# Patient Record
Sex: Female | Born: 1965 | Race: White | Hispanic: No | Smoking: Former smoker
Health system: Southern US, Community
[De-identification: ages and names within clinical notes are randomized; demographics above are authoritative.]

## PROBLEM LIST (undated history)

## (undated) DIAGNOSIS — F32A Depression, unspecified: Secondary | ICD-10-CM

## (undated) DIAGNOSIS — K219 Gastro-esophageal reflux disease without esophagitis: Secondary | ICD-10-CM

## (undated) DIAGNOSIS — F329 Major depressive disorder, single episode, unspecified: Secondary | ICD-10-CM

## (undated) DIAGNOSIS — R51 Headache: Secondary | ICD-10-CM

## (undated) DIAGNOSIS — F419 Anxiety disorder, unspecified: Secondary | ICD-10-CM

---
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
## 2005-03-24 HISTORY — PX: TONSILLECTOMY: SUR1361

## 2006-06-01 ENCOUNTER — Ambulatory Visit: Payer: Self-pay | Admitting: Family Medicine

## 2006-07-23 ENCOUNTER — Encounter: Admission: RE | Admit: 2006-07-23 | Discharge: 2006-07-23 | Payer: Self-pay | Admitting: Obstetrics and Gynecology

## 2006-08-25 ENCOUNTER — Ambulatory Visit: Payer: Self-pay | Admitting: Family Medicine

## 2006-08-25 LAB — CONVERTED CEMR LAB
Albumin: 4.1 g/dL (ref 3.5–5.2)
Basophils Relative: 0 % (ref 0.0–1.0)
Bilirubin, Direct: 0.1 mg/dL (ref 0.0–0.3)
CO2: 30 meq/L (ref 19–32)
Chloride: 107 meq/L (ref 96–112)
Creatinine, Ser: 0.9 mg/dL (ref 0.4–1.2)
Direct LDL: 176.1 mg/dL
Eosinophils Absolute: 0.1 10*3/uL (ref 0.0–0.6)
Hemoglobin: 14.8 g/dL (ref 12.0–15.0)
Lymphocytes Relative: 46.2 % — ABNORMAL HIGH (ref 12.0–46.0)
MCHC: 34.9 g/dL (ref 30.0–36.0)
MCV: 84.8 fL (ref 78.0–100.0)
Monocytes Relative: 8.5 % (ref 3.0–11.0)
Neutro Abs: 3.2 10*3/uL (ref 1.4–7.7)
Neutrophils Relative %: 43.8 % (ref 43.0–77.0)
Platelets: 319 10*3/uL (ref 150–400)
RBC: 5 M/uL (ref 3.87–5.11)
TSH: 2.03 microintl units/mL (ref 0.35–5.50)
Total Bilirubin: 0.7 mg/dL (ref 0.3–1.2)
Total Protein: 7.5 g/dL (ref 6.0–8.3)

## 2006-09-04 ENCOUNTER — Ambulatory Visit: Payer: Self-pay | Admitting: Family Medicine

## 2006-09-10 ENCOUNTER — Ambulatory Visit: Payer: Self-pay | Admitting: Family Medicine

## 2006-11-26 ENCOUNTER — Ambulatory Visit: Payer: Self-pay | Admitting: Family Medicine

## 2006-11-26 DIAGNOSIS — R519 Headache, unspecified: Secondary | ICD-10-CM | POA: Insufficient documentation

## 2006-11-26 DIAGNOSIS — I781 Nevus, non-neoplastic: Secondary | ICD-10-CM

## 2006-11-26 DIAGNOSIS — R51 Headache: Secondary | ICD-10-CM

## 2006-11-26 DIAGNOSIS — H8309 Labyrinthitis, unspecified ear: Secondary | ICD-10-CM | POA: Insufficient documentation

## 2007-01-14 ENCOUNTER — Telehealth (INDEPENDENT_AMBULATORY_CARE_PROVIDER_SITE_OTHER): Payer: Self-pay | Admitting: *Deleted

## 2007-01-15 ENCOUNTER — Ambulatory Visit: Payer: Self-pay | Admitting: Family Medicine

## 2007-01-15 LAB — CONVERTED CEMR LAB: Vitamin B-12: 269 pg/mL (ref 211–911)

## 2007-02-04 DIAGNOSIS — K602 Anal fissure, unspecified: Secondary | ICD-10-CM | POA: Insufficient documentation

## 2007-03-04 ENCOUNTER — Telehealth: Payer: Self-pay | Admitting: Family Medicine

## 2007-03-05 ENCOUNTER — Ambulatory Visit: Payer: Self-pay | Admitting: Family Medicine

## 2007-05-05 ENCOUNTER — Ambulatory Visit: Payer: Self-pay | Admitting: Family Medicine

## 2007-06-09 DIAGNOSIS — R1011 Right upper quadrant pain: Secondary | ICD-10-CM | POA: Insufficient documentation

## 2007-07-06 ENCOUNTER — Telehealth: Payer: Self-pay | Admitting: Family Medicine

## 2007-07-06 ENCOUNTER — Ambulatory Visit: Payer: Self-pay | Admitting: Family Medicine

## 2007-07-06 DIAGNOSIS — J45909 Unspecified asthma, uncomplicated: Secondary | ICD-10-CM | POA: Insufficient documentation

## 2007-07-06 LAB — CONVERTED CEMR LAB
ALT: 25 units/L (ref 0–35)
AST: 30 units/L (ref 0–37)
BUN: 6 mg/dL (ref 6–23)
Bilirubin, Direct: 0.1 mg/dL (ref 0.0–0.3)
CO2: 28 meq/L (ref 19–32)
Eosinophils Relative: 2.7 % (ref 0.0–5.0)
HCT: 41.9 % (ref 36.0–46.0)
Hemoglobin: 14.2 g/dL (ref 12.0–15.0)
MCHC: 33.9 g/dL (ref 30.0–36.0)
MCV: 87.6 fL (ref 78.0–100.0)
Monocytes Absolute: 0.8 10*3/uL (ref 0.1–1.0)
Neutro Abs: 2.7 10*3/uL (ref 1.4–7.7)
Platelets: 302 10*3/uL (ref 150–400)
Potassium: 3.6 meq/L (ref 3.5–5.1)
RBC: 4.78 M/uL (ref 3.87–5.11)
RDW: 12.3 % (ref 11.5–14.6)
Total Protein: 7.3 g/dL (ref 6.0–8.3)
WBC: 5.5 10*3/uL (ref 4.5–10.5)

## 2007-07-09 ENCOUNTER — Encounter: Admission: RE | Admit: 2007-07-09 | Discharge: 2007-07-09 | Payer: Self-pay | Admitting: Family Medicine

## 2007-07-13 ENCOUNTER — Ambulatory Visit: Payer: Self-pay | Admitting: Family Medicine

## 2007-08-12 ENCOUNTER — Encounter: Admission: RE | Admit: 2007-08-12 | Discharge: 2007-08-12 | Payer: Self-pay | Admitting: Obstetrics and Gynecology

## 2007-08-17 ENCOUNTER — Telehealth: Payer: Self-pay | Admitting: Family Medicine

## 2007-08-19 ENCOUNTER — Telehealth: Payer: Self-pay | Admitting: Internal Medicine

## 2007-08-31 ENCOUNTER — Ambulatory Visit: Payer: Self-pay | Admitting: Family Medicine

## 2007-08-31 LAB — CONVERTED CEMR LAB
Alkaline Phosphatase: 50 units/L (ref 39–117)
Basophils Relative: 0 % (ref 0.0–1.0)
Bilirubin Urine: NEGATIVE
Bilirubin, Direct: 0.1 mg/dL (ref 0.0–0.3)
Chloride: 103 meq/L (ref 96–112)
Cholesterol: 224 mg/dL (ref 0–200)
Eosinophils Relative: 1.4 % (ref 0.0–5.0)
GFR calc Af Amer: 90 mL/min
GFR calc non Af Amer: 74 mL/min
HDL: 36.7 mg/dL — ABNORMAL LOW (ref >39.0)
Ketones, urine, test strip: NEGATIVE
Lymphocytes Relative: 44.5 % (ref 12.0–46.0)
MCV: 89.9 fL (ref 78.0–100.0)
Monocytes Absolute: 0.4 10*3/uL (ref 0.1–1.0)
Monocytes Relative: 6.1 % (ref 3.0–12.0)
Neutro Abs: 2.9 10*3/uL (ref 1.4–7.7)
Nitrite: NEGATIVE
Platelets: 313 10*3/uL (ref 150–400)
Protein, U semiquant: NEGATIVE
RDW: 12.8 % (ref 11.5–14.6)
Specific Gravity, Urine: <1.005
TSH: 1.59 microintl units/mL (ref 0.35–5.50)
Total Bilirubin: 0.5 mg/dL (ref 0.3–1.2)
Urobilinogen, UA: 0.2
VLDL: 23 mg/dL (ref 0–40)
WBC Urine, dipstick: NEGATIVE
WBC: 6.1 10*3/uL (ref 4.5–10.5)

## 2007-09-07 ENCOUNTER — Ambulatory Visit: Payer: Self-pay | Admitting: Family Medicine

## 2007-09-07 DIAGNOSIS — F411 Generalized anxiety disorder: Secondary | ICD-10-CM | POA: Insufficient documentation

## 2008-07-07 ENCOUNTER — Ambulatory Visit (HOSPITAL_COMMUNITY): Admission: RE | Admit: 2008-07-07 | Discharge: 2008-07-07 | Payer: Self-pay | Admitting: Obstetrics and Gynecology

## 2008-08-14 ENCOUNTER — Encounter: Admission: RE | Admit: 2008-08-14 | Discharge: 2008-08-14 | Payer: Self-pay | Admitting: Obstetrics and Gynecology

## 2008-09-01 ENCOUNTER — Ambulatory Visit: Payer: Self-pay | Admitting: Family Medicine

## 2008-09-01 LAB — CONVERTED CEMR LAB
AST: 34 units/L (ref 0–37)
BUN: 15 mg/dL (ref 6–23)
Basophils Absolute: 0 10*3/uL (ref 0.0–0.1)
Bilirubin, Direct: 0.2 mg/dL (ref 0.0–0.3)
CO2: 27 meq/L (ref 19–32)
Chloride: 110 meq/L (ref 96–112)
Cholesterol: 225 mg/dL — ABNORMAL HIGH (ref 0–200)
Eosinophils Absolute: 0.1 10*3/uL (ref 0.0–0.7)
Eosinophils Relative: 1.2 % (ref 0.0–5.0)
GFR calc non Af Amer: 84.13 mL/min (ref >60)
Glucose, Bld: 106 mg/dL — ABNORMAL HIGH (ref 70–99)
Lymphocytes Relative: 35.6 % (ref 12.0–46.0)
MCHC: 35.1 g/dL (ref 30.0–36.0)
MCV: 87 fL (ref 78.0–100.0)
Monocytes Absolute: 0.4 10*3/uL (ref 0.1–1.0)
Monocytes Relative: 6.1 % (ref 3.0–12.0)
Neutro Abs: 4 10*3/uL (ref 1.4–7.7)
Neutrophils Relative %: 56.9 % (ref 43.0–77.0)
Potassium: 4.3 meq/L (ref 3.5–5.1)
RDW: 12.9 % (ref 11.5–14.6)
Sodium: 141 meq/L (ref 135–145)
Total Protein, Urine: NEGATIVE mg/dL
Total Protein: 7.1 g/dL (ref 6.0–8.3)
pH: 6 (ref 5.0–8.0)

## 2008-09-07 ENCOUNTER — Ambulatory Visit: Payer: Self-pay | Admitting: Family Medicine

## 2008-10-30 ENCOUNTER — Telehealth: Payer: Self-pay | Admitting: Family Medicine

## 2008-10-31 ENCOUNTER — Telehealth: Payer: Self-pay | Admitting: Family Medicine

## 2008-11-02 ENCOUNTER — Ambulatory Visit: Payer: Self-pay | Admitting: Family Medicine

## 2008-11-02 DIAGNOSIS — J157 Pneumonia due to Mycoplasma pneumoniae: Secondary | ICD-10-CM

## 2008-11-20 ENCOUNTER — Ambulatory Visit: Payer: Self-pay | Admitting: Family Medicine

## 2008-11-21 ENCOUNTER — Telehealth: Payer: Self-pay | Admitting: Family Medicine

## 2008-11-23 ENCOUNTER — Telehealth: Payer: Self-pay | Admitting: Family Medicine

## 2008-12-01 ENCOUNTER — Ambulatory Visit: Payer: Self-pay | Admitting: Family Medicine

## 2008-12-01 DIAGNOSIS — R5381 Other malaise: Secondary | ICD-10-CM

## 2008-12-01 DIAGNOSIS — R5383 Other fatigue: Secondary | ICD-10-CM

## 2008-12-11 ENCOUNTER — Telehealth: Payer: Self-pay | Admitting: Family Medicine

## 2008-12-15 ENCOUNTER — Telehealth: Payer: Self-pay | Admitting: Family Medicine

## 2009-02-12 ENCOUNTER — Telehealth: Payer: Self-pay | Admitting: Family Medicine

## 2009-04-06 ENCOUNTER — Ambulatory Visit: Payer: Self-pay | Admitting: Family Medicine

## 2009-05-02 ENCOUNTER — Telehealth: Payer: Self-pay | Admitting: Family Medicine

## 2009-07-04 ENCOUNTER — Telehealth: Payer: Self-pay | Admitting: Family Medicine

## 2009-08-21 ENCOUNTER — Encounter: Admission: RE | Admit: 2009-08-21 | Discharge: 2009-08-21 | Payer: Self-pay | Admitting: Obstetrics and Gynecology

## 2009-09-04 ENCOUNTER — Ambulatory Visit: Payer: Self-pay | Admitting: Family Medicine

## 2009-09-04 LAB — CONVERTED CEMR LAB
AST: 21 units/L (ref 0–37)
Alkaline Phosphatase: 48 units/L (ref 39–117)
Bilirubin Urine: NEGATIVE
CO2: 28 meq/L (ref 19–32)
Calcium: 9.5 mg/dL (ref 8.4–10.5)
Chloride: 103 meq/L (ref 96–112)
Creatinine, Ser: 0.7 mg/dL (ref 0.4–1.2)
Direct LDL: 160.7 mg/dL
Glucose, Urine, Semiquant: NEGATIVE
HDL: 42.7 mg/dL (ref >39.00)
Hemoglobin: 14.3 g/dL (ref 12.0–15.0)
Ketones, urine, test strip: NEGATIVE
Lymphocytes Relative: 43.8 % (ref 12.0–46.0)
MCHC: 34.2 g/dL (ref 30.0–36.0)
Monocytes Relative: 5.8 % (ref 3.0–12.0)
Neutro Abs: 3.7 10*3/uL (ref 1.4–7.7)
Neutrophils Relative %: 48.5 % (ref 43.0–77.0)
Nitrite: NEGATIVE
Platelets: 313 10*3/uL (ref 150.0–400.0)
Potassium: 4.5 meq/L (ref 3.5–5.1)
Protein, U semiquant: NEGATIVE
Sodium: 140 meq/L (ref 135–145)
Total CHOL/HDL Ratio: 6
VLDL: 53.8 mg/dL — ABNORMAL HIGH (ref 0.0–40.0)
WBC Urine, dipstick: NEGATIVE

## 2009-09-10 ENCOUNTER — Ambulatory Visit: Payer: Self-pay | Admitting: Family Medicine

## 2009-09-13 ENCOUNTER — Telehealth: Payer: Self-pay | Admitting: Family Medicine

## 2009-10-08 ENCOUNTER — Ambulatory Visit: Payer: Self-pay | Admitting: Family Medicine

## 2009-10-29 ENCOUNTER — Ambulatory Visit: Payer: Self-pay | Admitting: Family Medicine

## 2009-12-28 ENCOUNTER — Telehealth: Payer: Self-pay | Admitting: Family Medicine

## 2010-02-04 ENCOUNTER — Telehealth: Payer: Self-pay | Admitting: Family Medicine

## 2010-04-14 ENCOUNTER — Encounter: Payer: Self-pay | Admitting: Family Medicine

## 2010-04-23 NOTE — Assessment & Plan Note (Signed)
Summary: CPX/CJR   Vital Signs:  Patient profile:   45 year old female Height:      67.5 inches Weight:      157 pounds BMI:     24.31 Temp:     98.4 degrees F oral BP sitting:   124 / 78  (left arm) Cuff size:   regular  Vitals Entered By: Kern Reap CMA Duncan Dull) (September 10, 2009 2:54 PM) CC: cpx   CC:  cpx.  History of Present Illness: Cheryl Brooks  is a 45 year old female, who comes in today for physical examination  she has a history of underlying allergic rhinitis, for which he takes Singulair 10 mg daily and Provera p.r.n. for asthma.  No major symptoms.  This year.  She takes BCPs from a GYN.  She takes diazepam 2 mg p.r.n. for anxiety.  She takes Imitrex 100 mg p.o. p.r.n. for migraine headaches.  Also, Vicodin, and we will add some Phenergan p.o..  She also takes Ambien 10 mg nightly p.r.n. for sleep.  Review of systems otherwise negative.  Tetanus fissure 2004 seasonal flu 2010 mammogram May 2011 normal.  She does have a history of fibrocystic changes  Allergies (verified): No Known Drug Allergies  Past History:  Past medical, surgical, family and social histories (including risk factors) reviewed, and no changes noted (except as noted below).  Past Medical History: Reviewed history from 09/07/2007 and no changes required. 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 Family History of Asthma father has glaucoma  Social History: Reviewed history from 09/07/2007 and no changes required. Married Never Smoked Alcohol use-no Drug use-no Regular exercise-yes Occupation: [employer]  Review of Systems      See HPI  Physical Exam  General:  Well-developed,well-nourished,in no acute distress; alert,appropriate and cooperative throughout examination Head:  Normocephalic and atraumatic without obvious abnormalities. No apparent alopecia  or balding. Eyes:  No corneal or conjunctival inflammation noted. EOMI. Perrla. Funduscopic exam benign, without hemorrhages, exudates or papilledema. Vision grossly normal. Ears:  External ear exam shows no significant lesions or deformities.  Otoscopic examination reveals clear canals, tympanic membranes are intact bilaterally without bulging, retraction, inflammation or discharge. Hearing is grossly normal bilaterally. Nose:  External nasal examination shows no deformity or inflammation. Nasal mucosa are pink and moist without lesions or exudates. Mouth:  Oral mucosa and oropharynx without lesions or exudates.  Teeth in good repair. Neck:  No deformities, masses, or tenderness noted. Chest Wall:  No deformities, masses, or tenderness noted. Breasts:  multiple fibrocystic changes both breasts Lungs:  Normal respiratory effort, chest expands symmetrically. Lungs are clear to auscultation, no crackles or wheezes. Heart:  Normal rate and regular rhythm. S1 and S2 normal without gallop, murmur, click, rub or other extra sounds. Abdomen:  Bowel sounds positive,abdomen soft and non-tender without masses, organomegaly or hernias noted. Msk:  No deformity or scoliosis noted of thoracic or lumbar spine.   Pulses:  R and L carotid,radial,femoral,dorsalis pedis and posterior tibial pulses are full and equal bilaterally Extremities:  No clubbing, cyanosis, edema, or deformity noted with normal full range of motion of all joints.   Neurologic:  No cranial nerve deficits noted. Station and gait are normal. Plantar reflexes are down-going bilaterally. DTRs are symmetrical throughout. Sensory, motor and coordinative functions appear intact. Skin:  Intact without suspicious lesions or rashes Cervical Nodes:  No lymphadenopathy noted Axillary Nodes:  No palpable  lymphadenopathy Inguinal Nodes:  No significant adenopathy Psych:  Cognition and judgment appear intact. Alert and cooperative with normal attention span  and concentration. No apparent delusions, illusions, hallucinations   Impression & Recommendations:  Problem # 1:  PHYSICAL EXAMINATION (ICD-V70.0) Assessment Unchanged  Orders: Prescription Created Electronically [phone])  Problem # 2:  ASTHMA (ICD-493.90) Assessment: Improved  Her updated medication list for this problem includes:    Singulair 10 Mg Tabs (Montelukast sodium) .Marland Kitchen... 1 qam    Proair Hfa 108 (90 Base) Mcg/act Aers (Albuterol sulfate) ..... Use as directed  Orders: Prescription Created Electronically [phone])  Problem # 3:  HEADACHE (ICD-784.0) Assessment: Unchanged  Her updated medication list for this problem includes:    Imitrex 100 Mg Tabs (Sumatriptan succinate) .Marland KitchenMarland KitchenMarland KitchenMarland Kitchen 1with h/a; then 1tab in 2 hrs    Vicodin Es 7.5-750 Mg Tabs (Hydrocodone-acetaminophen) .Marland Kitchen... Take half to one tab every 4 hours as needed for migraines    Aspirin 81 Mg Tbec (Aspirin) .Marland Kitchen... Take one tab by mouth once daily  Orders: Prescription Created Electronically [phone])  Complete Medication List: 1)  Zantac  2)  Singulair 10 Mg Tabs (Montelukast sodium) .Marland Kitchen.. 1 qam 3)  Loestrin 24 Fe 1-20 Mg-mcg Tabs (Norethin ace-eth estrad-fe) .Marland Kitchen.. 1 qd 4)  Diazepam 2 Mg Tabs (Diazepam) .... As needed 5)  Imitrex 100 Mg Tabs (Sumatriptan succinate) .Marland Kitchen.. 1with h/a; then 1tab in 2 hrs 6)  Anusol-hc 25 Mg Supp (Hydrocortisone acetate) .... One rectally at bedtime 7)  Ambien 10 Mg Tabs (Zolpidem tartrate) .... One by mouth q hs as needed for sleep 8)  Proair Hfa 108 (90 Base) Mcg/act Aers (Albuterol sulfate) .... Use as directed 9)  Vicodin Es 7.5-750 Mg Tabs (Hydrocodone-acetaminophen) .... Take half to one tab every 4 hours as needed for migraines 10)  Aspirin 81 Mg Tbec (Aspirin) .... Take one tab by mouth once daily 11)  Promethazine Hcl 25 Mg Tabs (Promethazine hcl) .... Take 1 tablet by mouth three times a day as needed  Patient Instructions: 1)  Please schedule a follow-up appointment in 1  year. 2)  It is important that you exercise regularly at least 20 minutes 5 times a week. If you develop chest pain, have severe difficulty breathing, or feel very tired , stop exercising immediately and seek medical attention. 3)  Schedule your mammogram. 4)  SPF 50+ sunscreens Prescriptions: VICODIN ES 7.5-750 MG TABS (HYDROCODONE-ACETAMINOPHEN) take half to one tab every 4 hours as needed for migraines  #30 x 1   Entered and Authorized by:   Roderick Pee MD   Signed by:   Roderick Pee MD on 09/10/2009   Method used:   Print then Give to Patient   RxID:   [id number] AMBIEN 10 MG  TABS (ZOLPIDEM TARTRATE) one by mouth q hs as needed for sleep  #30 x 6   Entered and Authorized by:   Roderick Pee MD   Signed by:   Roderick Pee MD on 09/10/2009   Method used:   Print then Give to Patient   RxID:   [id number] DIAZEPAM 2 MG  TABS (DIAZEPAM) as needed  #100 x 4   Entered and Authorized by:   Roderick Pee MD   Signed by:   Roderick Pee MD on 09/10/2009   Method used:   Print then Give to Patient   RxID:   [id number] PROMETHAZINE HCL 25 MG TABS (PROMETHAZINE HCL) Take 1 tablet by mouth three times a day as  needed  #30 x 2   Entered and Authorized by:   Roderick Pee MD   Signed by:   Roderick Pee MD on 09/10/2009   Method used:   Electronically to        [facility]* (retail)       [address] [place], [place]  [address]       Ph: [phone]       Fax: [phone]   RxID:   [id number] PROAIR HFA 108 (90 BASE) MCG/ACT AERS (ALBUTEROL SULFATE) use as directed  #2 x 3   Entered and Authorized by:   Roderick Pee MD   Signed by:   Roderick Pee MD on 09/10/2009   Method used:   Electronically to        [facility]* (retail)       [address] [place], [place]  [address]       Ph: [phone]       Fax: [phone]   RxID:   [id number] IMITREX 100 MG  TABS (SUMATRIPTAN SUCCINATE) 1with H/A; then 1TAB in 2 hrs  #6 x 11    Entered and Authorized by:   Roderick Pee MD   Signed by:   Roderick Pee MD on 09/10/2009   Method used:   Electronically to        [facility]* (retail)       [address] [place], [place]  [address]       Ph: [phone]       Fax: [phone]   RxID:   [id number] SINGULAIR 10 MG  TABS (MONTELUKAST SODIUM) 1 qam  #100 x 3   Entered and Authorized by:   Roderick Pee MD   Signed by:   Roderick Pee MD on 09/10/2009   Method used:   Electronically to        [facility]* (retail)       [address] [place], [place]  [address]       Ph: [phone]       Fax: [phone]   RxID:   [id number]    Immunization History:  Influenza Immunization History:    Influenza:  historical (12/22/2008)

## 2010-04-23 NOTE — Assessment & Plan Note (Signed)
Summary: cough/some sob/njr   Vital Signs:  Patient profile:   45 year old female Weight:      159 pounds Temp:     98.8 degrees F oral BP sitting:   120 / 80  (left arm) Cuff size:   regular  Vitals Entered By: Kern Reap CMA Duncan Dull) (October 08, 2009 12:49 PM) CC: cough, tired   CC:  cough and tired.  History of Present Illness: genever is a 45 year old female with underlying allergic rhinitis and occasional asthma, treated with albuterol p.r.n. and comes in today for a flare of her asthma x 3 days.  She does not recall any specific trigger review of systems negative  Allergies: No Known Drug Allergies  Past History:  Past medical, surgical, family and social histories (including risk factors) reviewed for relevance to current acute and chronic problems.  Past Medical History: Reviewed history from 09/07/2007 and no changes required. 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 Family History of Asthma father has glaucoma  Social History: Reviewed history from 09/07/2007 and no changes required. Married Never Smoked Alcohol use-no Drug use-no Regular exercise-yes Occupation: [employer]  Review of Systems      See HPI  Physical Exam  General:  Well-developed,well-nourished,in no acute distress; alert,appropriate and cooperative throughout examination Head:  Normocephalic and atraumatic without obvious abnormalities. No apparent alopecia or balding. Eyes:  No corneal or conjunctival inflammation noted. EOMI. Perrla. Funduscopic exam benign, without hemorrhages, exudates or papilledema. Vision grossly normal. Ears:  External ear exam shows no significant lesions or deformities.  Otoscopic examination reveals clear canals, tympanic membranes are intact bilaterally without bulging, retraction, inflammation or discharge. Hearing is grossly  normal bilaterally. Nose:  External nasal examination shows no deformity or inflammation. Nasal mucosa are pink and moist without lesions or exudates. Mouth:  Oral mucosa and oropharynx without lesions or exudates.  Teeth in good repair. Neck:  No deformities, masses, or tenderness noted. Chest Wall:  No deformities, masses, or tenderness noted. Lungs:  normal symmetrical breath sounds, mild late expiratory wheezing   Impression & Recommendations:  Problem # 1:  ASTHMA (ICD-493.90) Assessment Deteriorated  Her updated medication list for this problem includes:    Singulair 10 Mg Tabs (Montelukast sodium) .Marland Kitchen... 1 qam    Proair Hfa 108 (90 Base) Mcg/act Aers (Albuterol sulfate) ..... Use as directed    Qvar 40 Mcg/act Aers (Beclomethasone dipropionate) .Marland Kitchen... 2 ps two times a day  Orders: Prescription Created Electronically ([phone])  Complete Medication List: 1)  Zantac  2)  Singulair 10 Mg Tabs (Montelukast sodium) .Marland Kitchen.. 1 qam 3)  Loestrin 24 Fe 1-20 Mg-mcg Tabs (Norethin ace-eth estrad-fe) .Marland Kitchen.. 1 qd 4)  Diazepam 2 Mg Tabs (Diazepam) .... Take one tab two times a day as needed anxiety 5)  Imitrex 100 Mg Tabs (Sumatriptan succinate) .Marland Kitchen.. 1with h/a; then 1tab in 2 hrs 6)  Anusol-hc 25 Mg Supp (Hydrocortisone acetate) .... One rectally at bedtime 7)  Ambien 10 Mg Tabs (Zolpidem tartrate) .... One by mouth q hs as needed for sleep 8)  Proair Hfa 108 (90 Base) Mcg/act Aers (Albuterol sulfate) .... Use as directed 9)  Vicodin Es 7.5-750 Mg Tabs (Hydrocodone-acetaminophen) .... Take half to one tab every 4 hours as needed for migraines 10)  Aspirin 81 Mg Tbec (Aspirin) .... Take one tab by mouth once daily 11)  Promethazine Hcl 25 Mg Tabs (Promethazine hcl) .Marland KitchenMarland KitchenMarland Kitchen  Take 1 tablet by mouth three times a day as needed 12)  Qvar 40 Mcg/act Aers (Beclomethasone dipropionate) .... 2 ps two times a day  Patient Instructions: 1)  begin Qvar 44, dose         two puffs b.i.d. and use, one or two puffs a  day, albuterol, two to 3 times a day as needed.  Follow-up in 3 weeks Prescriptions: QVAR 40 MCG/ACT AERS (BECLOMETHASONE DIPROPIONATE) 2 ps two times a day  #2 x 6   Entered and Authorized by:   Roderick Pee MD   Signed by:   Roderick Pee MD on 10/08/2009   Method used:   Electronically to        [facility]* (retail)       [address] [place], [place]  [address]       Ph: [phone]       Fax: [phone]   RxID:   [id number]

## 2010-04-23 NOTE — Progress Notes (Signed)
Summary: cough no better  Phone Note Call from Patient Call back at [phone]   Caller: Patient Call For: Roderick Pee MD Summary of Call: Qvar is making pt asthma worst and alburetol is not helping, please advise pt still have cough. pharm [facility] [address] in [place] Initial call taken by: Heron Sabins,  February 04, 2010 2:46 PM  Follow-up for Phone Call        lmom Follow-up by: Heron Sabins,  February 04, 2010 3:03 PM  Additional Follow-up for Phone Call Additional follow up Details #1::        pt req doc to make decision if ov is needed Additional Follow-up by: Heron Sabins,  February 04, 2010 3:33 PM    New/Updated Medications: PULMICORT FLEXHALER 180 MCG/ACT AEPB (BUDESONIDE) 1 puff two times a day Prescriptions: PULMICORT FLEXHALER 180 MCG/ACT AEPB (BUDESONIDE) 1 puff two times a day  #1 x 11   Entered and Authorized by:   Roderick Pee MD   Signed by:   Roderick Pee MD on 02/04/2010   Method used:   Electronically to        [facility]* (retail)       [address] [place], [place]  [address]       Ph: [phone]       Fax: [phone]   RxID:   [id number]

## 2010-04-23 NOTE — Assessment & Plan Note (Signed)
Summary: COUGH/HEAD AND CHEST CONGESTION/CJR   Vital Signs:  Patient profile:   45 year old female Weight:      156 pounds Temp:     98.6 degrees F oral Resp:     16 per minute BP sitting:   118 / 62  Vitals Entered By: Lynann Beaver CMA (April 06, 2009 12:08 PM) CC: cough and congestion x 4 days Is Patient Diabetic? No Pain Assessment Patient in pain? no        CC:  cough and congestion x 4 days.  History of Present Illness: Cheryl Brooks is a 45 year old single female, nonsmoker, who comes in with a 4-day history of congestion, nonproductive cough.  She has a history of underlying allergic rhinitis and asthma.  She's not had to use her inhalers.  Review of systems negative  Current Medications (verified): 1)  Zantac 2)  Singulair 10 Mg  Tabs (Montelukast Sodium) .Marland Kitchen.. 1 Qam 3)  Loestrin 24 Fe 1-20 Mg-Mcg  Tabs (Norethin Ace-Eth Estrad-Fe) .Marland Kitchen.. 1 Qd 4)  Diazepam 2 Mg  Tabs (Diazepam) .... As Needed 5)  Imitrex 100 Mg  Tabs (Sumatriptan Succinate) .Marland Kitchen.. 1with H/a; Then 1tab in 2 Hrs 6)  Anusol-Hc 25 Mg  Supp (Hydrocortisone Acetate) .... One Rectally At Bedtime 7)  Ambien 10 Mg  Tabs (Zolpidem Tartrate) .... One By Mouth Q Hs As Needed For Sleep 8)  Proair Hfa 108 (90 Base) Mcg/act Aers (Albuterol Sulfate) .... Use As Directed 9)  Hydromet 5-1.5 Mg/71ml Syrp (Hydrocodone-Homatropine) .Marland Kitchen.. 1 or 2 Tsps At Bedtime As Needed 10)  Vicodin Es 7.5-750 Mg Tabs (Hydrocodone-Acetaminophen) .... Take Half To One Tab Every 4 Hours As Needed For Migraines  Allergies (verified): No Known Drug Allergies  Past History:  Past medical, surgical, family and social histories (including risk factors) reviewed for relevance to current acute and chronic problems.  Past Medical History: Reviewed history from 09/07/2007 and no changes required. 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 Family History of Asthma father has glaucoma  Social History: Reviewed history from 09/07/2007 and no changes required. Married Never Smoked Alcohol use-no Drug use-no Regular exercise-yes Occupation: [employer]  Review of Systems      See HPI  Physical Exam  General:  Well-developed,well-nourished,in no acute distress; alert,appropriate and cooperative throughout examination Head:  Normocephalic and atraumatic without obvious abnormalities. No apparent alopecia or balding. Eyes:  No corneal or conjunctival inflammation noted. EOMI. Perrla. Funduscopic exam benign, without hemorrhages, exudates or papilledema. Vision grossly normal. Ears:  External ear exam shows no significant lesions or deformities.  Otoscopic examination reveals clear canals, tympanic membranes are intact bilaterally without bulging, retraction, inflammation or discharge. Hearing is grossly normal bilaterally. Nose:  External nasal examination shows no deformity or inflammation. Nasal mucosa are pink and moist without lesions or exudates. Mouth:  Oral mucosa and oropharynx without lesions or exudates.  Teeth in good repair. Neck:  No deformities, masses, or tenderness noted. Chest Wall:  No deformities, masses, or tenderness noted. Lungs:  Normal respiratory effort, chest expands symmetrically. Lungs are clear to auscultation, no crackles or wheezes.   Problems:  Medical Problems Added: 1)  Dx of Viral Infection-unspec  (ICD-079.99)  Impression & Recommendations:  Problem # 1:  VIRAL INFECTION-UNSPEC (ICD-079.99) Assessment New  Her updated medication list for this problem includes:    Hydromet 5-1.5 Mg/73ml Syrp (Hydrocodone-homatropine) .Marland Kitchen... 1 or 2 tsps at  bedtime as needed    Hydromet 5-1.5 Mg/62ml Syrp (Hydrocodone-homatropine) .Marland Kitchen... 1 or 2 tsps three times a day as needed  Complete Medication List: 1)  Zantac  2)  Singulair 10 Mg Tabs (Montelukast sodium) .Marland Kitchen.. 1 qam 3)  Loestrin  24 Fe 1-20 Mg-mcg Tabs (Norethin ace-eth estrad-fe) .Marland Kitchen.. 1 qd 4)  Diazepam 2 Mg Tabs (Diazepam) .... As needed 5)  Imitrex 100 Mg Tabs (Sumatriptan succinate) .Marland Kitchen.. 1with h/a; then 1tab in 2 hrs 6)  Anusol-hc 25 Mg Supp (Hydrocortisone acetate) .... One rectally at bedtime 7)  Ambien 10 Mg Tabs (Zolpidem tartrate) .... One by mouth q hs as needed for sleep 8)  Proair Hfa 108 (90 Base) Mcg/act Aers (Albuterol sulfate) .... Use as directed 9)  Hydromet 5-1.5 Mg/77ml Syrp (Hydrocodone-homatropine) .Marland Kitchen.. 1 or 2 tsps at bedtime as needed 10)  Vicodin Es 7.5-750 Mg Tabs (Hydrocodone-acetaminophen) .... Take half to one tab every 4 hours as needed for migraines 11)  Hydromet 5-1.5 Mg/39ml Syrp (Hydrocodone-homatropine) .Marland Kitchen.. 1 or 2 tsps three times a day as needed  Other Orders: Prescription Created Electronically [id number])  Patient Instructions: 1)  Get plenty of rest, drink lots of clear liquids, and use Tylenol or Ibuprofen for fever and comfort. Return in 7-10 days if you're not better:sooner if you're feeling worse. 2)  Take 650-1000mg  of Tylenol every 4-6 hours as needed for relief of pain or comfort of fever AVOID taking more than 4000mg   in a 24 hour period (can cause liver damage in higher doses). 3)  drink 30 ounces of water daily.  u  may take one or 2 teaspoons of Hydromet at bedtime as needed for cough.  Return p.r.n. Prescriptions: HYDROMET 5-1.5 MG/5ML SYRP (HYDROCODONE-HOMATROPINE) 1 or 2 tsps three times a day as needed  #8oz x 1   Entered and Authorized by:   Roderick Pee MD   Signed by:   Roderick Pee MD on 04/06/2009   Method used:   Print then Give to Patient   RxID:   [id number]

## 2010-04-23 NOTE — Progress Notes (Signed)
Summary: wants Dr Nelida Meuse opinion  Phone Note Call from Patient Call back at [phone]   Caller: Patient---live call Reason for Call: Talk to Doctor Summary of Call: Would like for Dr Tawanna Cooler to return call. She considering doing a procedure---laser hair removal. Requesting opinion from Dr Tawanna Cooler. Initial call taken by: Warnell Forester,  May 02, 2009 8:58 AM  Follow-up for Phone Call        Provider Notified Follow-up by: Roderick Pee MD,  May 02, 2009 1:26 PM

## 2010-04-23 NOTE — Progress Notes (Signed)
Summary: sore mouth  Phone Note Call from Patient   Caller: Patient Call For: Roderick Pee MD Summary of Call: Pt wants to know if this is something Dr. Tawanna Cooler can treat over the phone??? Initial call taken by: Lynann Beaver CMA,  December 28, 2009 10:00 AM  Follow-up for Phone Call        Fleet Contras please call Follow-up by: Roderick Pee MD,  December 28, 2009 10:14 AM  Additional Follow-up for Phone Call Additional follow up Details #1::        patient is going to gargle with salt water and if not better by monday she will give [place] a call.  she did say they were better today. Additional Follow-up by: Kern Reap CMA Duncan Dull),  December 28, 2009 10:23 AM

## 2010-04-23 NOTE — Progress Notes (Signed)
Summary: sore mouth  Phone Note Call from Patient Call back at [phone]   Summary of Call: Have inhaler.  Have had flu this week.  Sores in mouth.  Thrush or canker sores.   She thinks it's canker sores & will give it a few days to clear up, garling with salt water as needed.  Does not want appointment today.   Rudy Jew, RN  December 28, 2009 9:01 AM  Initial call taken by: Rudy Jew, RN,  December 28, 2009 8:58 AM

## 2010-04-23 NOTE — Progress Notes (Signed)
Summary: diazepam directions  Phone Note From Pharmacy   Summary of Call: pharmacy is calling for direction on patient's rx for diazepam. [facility] [place] [phone] Initial call taken by: Kern Reap CMA Duncan Dull),  September 13, 2009 2:29 PM  Follow-up for Phone Call        one b.i.d. p.r.n. for anxiety Follow-up by: Roderick Pee MD,  September 13, 2009 2:49 PM  Additional Follow-up for Phone Call Additional follow up Details #1::        Pharmacist called Additional Follow-up by: Kern Reap CMA Duncan Dull),  September 13, 2009 2:55 PM    New/Updated Medications: DIAZEPAM 2 MG  TABS (DIAZEPAM) take one tab two times a day as needed anxiety

## 2010-04-23 NOTE — Assessment & Plan Note (Signed)
Summary: 3 WK ROV/NJR   Vital Signs:  Patient profile:   45 year old female Weight:      158 pounds Temp:     98.9 degrees F oral BP sitting:   120 / 84  (left arm) Cuff size:   regular  Vitals Entered By: Kern Reap CMA Duncan Dull) (October 29, 2009 3:14 PM) CC: follow-up visit   CC:  follow-up visit.  History of Present Illness: Cheryl Brooks is a 45 year old married female, who comes in today for follow-up of asthma.  She had a viral infection that triggered her asthma and in we put her on Qvar 42 puffs b.i.d. 3 weeks ago, and she states she is markedly improved a little cough, but she states she is about 75% better.  We discussed the long term prognosis, and I recommended she stay on one pack b.i.d. all year round to increase to two puffs b.i.d. when she has a flare or she gets a virus.  I think in the long run she would be better off take a little bit on a daily basis to stop these recurrent episodes.  She reluctantly agrees  Allergies: No Known Drug Allergies  Past History:  Past medical, surgical, family and social histories (including risk factors) reviewed for relevance to current acute and chronic problems.  Past Medical History: Reviewed history from 09/07/2007 and no changes required. 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 Family History of Asthma father has glaucoma  Social History: Reviewed history from 09/07/2007 and no changes required. Married Never Smoked Alcohol use-no Drug use-no Regular exercise-yes Occupation: [employer]  Review of Systems      See HPI  Physical Exam  General:  Well-developed,well-nourished,in no acute distress; alert,appropriate and cooperative throughout examination Lungs:  Normal respiratory effort, chest expands symmetrically. Lungs are clear to auscultation, no crackles or wheezes.   Impression &  Recommendations:  Problem # 1:  ASTHMA (ICD-493.90) Assessment Improved  Her updated medication list for this problem includes:    Singulair 10 Mg Tabs (Montelukast sodium) .Marland Kitchen... 1 qam    Proair Hfa 108 (90 Base) Mcg/act Aers (Albuterol sulfate) ..... Use as directed    Qvar 40 Mcg/act Aers (Beclomethasone dipropionate) .Marland Kitchen... 2 ps two times a day  Complete Medication List: 1)  Zantac  2)  Singulair 10 Mg Tabs (Montelukast sodium) .Marland Kitchen.. 1 qam 3)  Loestrin 24 Fe 1-20 Mg-mcg Tabs (Norethin ace-eth estrad-fe) .Marland Kitchen.. 1 qd 4)  Diazepam 2 Mg Tabs (Diazepam) .... Take one tab two times a day as needed anxiety 5)  Imitrex 100 Mg Tabs (Sumatriptan succinate) .Marland Kitchen.. 1with h/a; then 1tab in 2 hrs 6)  Anusol-hc 25 Mg Supp (Hydrocortisone acetate) .... One rectally at bedtime 7)  Ambien 10 Mg Tabs (Zolpidem tartrate) .... One by mouth q hs as needed for sleep 8)  Proair Hfa 108 (90 Base) Mcg/act Aers (Albuterol sulfate) .... Use as directed 9)  Vicodin Es 7.5-750 Mg Tabs (Hydrocodone-acetaminophen) .... Take half to one tab every 4 hours as needed for migraines 10)  Aspirin 81 Mg Tbec (Aspirin) .... Take one tab by mouth once daily 11)  Promethazine Hcl 25 Mg Tabs (Promethazine hcl) .... Take 1 tablet by mouth three times a day as needed 12)  Qvar 40 Mcg/act Aers (Beclomethasone dipropionate) .... 2 ps two times a day  Patient Instructions: 1)  continue 2 puffs  of the Qvar  twice daily through August then starting in September.  Decrease the dose to one puff twice daily.  If u  developed a viral infection or get around something, your allergic to then go back and increased her medication to two puffs twice a day for a couple weeks.  Return p.r.n. and remember to get your flu shot in the fall

## 2010-04-23 NOTE — Progress Notes (Signed)
Summary: hydrocodone refill  Phone Note From Pharmacy   Summary of Call: patient is requesting a refill of hydrocodone  last office visit 1/11 is this okay to fill? Initial call taken by: Kern Reap CMA Duncan Dull),  July 04, 2009 8:47 AM  Follow-up for Phone Call        Vicodin ES dispense 30 tablets ......... uses directed, refills x 2 Follow-up by: Roderick Pee MD,  July 04, 2009 9:05 AM  Additional Follow-up for Phone Call Additional follow up Details #1::        Pharmacist called Additional Follow-up by: Kern Reap CMA Duncan Dull),  July 04, 2009 9:15 AM

## 2010-04-26 ENCOUNTER — Ambulatory Visit (INDEPENDENT_AMBULATORY_CARE_PROVIDER_SITE_OTHER): Payer: BC Managed Care – PPO | Admitting: Family Medicine

## 2010-04-26 ENCOUNTER — Encounter: Payer: Self-pay | Admitting: Family Medicine

## 2010-04-26 DIAGNOSIS — J45909 Unspecified asthma, uncomplicated: Secondary | ICD-10-CM

## 2010-04-26 DIAGNOSIS — R1011 Right upper quadrant pain: Secondary | ICD-10-CM

## 2010-04-26 DIAGNOSIS — R51 Headache: Secondary | ICD-10-CM

## 2010-04-26 MED ORDER — HYOSCYAMINE SULFATE 0.125 MG SL SUBL: 0.1250 mg | SUBLINGUAL_TABLET | SUBLINGUAL | Status: DC | PRN

## 2010-04-26 MED ORDER — ZOLMITRIPTAN 2.5 MG PO TBDP: 2.5000 mg | ORAL_TABLET | ORAL | Status: DC | PRN

## 2010-04-26 NOTE — Progress Notes (Signed)
  Subjective:    Patient ID: Cheryl Brooks, female    DOB: November 18, 1965, 45 y.o.   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  For the past two to 3, months.  She's had some intermittent left lower quadrant crampy abdominal pain, and 4 to 5 bowel movements a day.  Two weeks ago, when her divorce.  He did up and she put her house on the market for sale.  The cramps and diarrhea, increased gaseous 76 to 7 pounds today.  She's able to sleep all night.  No fever, chills, vomiting, etc..  Urinary habits.  Normal.  LMP normal on BCPs.  A year ago.  She had some left lower quadrant pain and her GYN ordered a CT scan of her abdomen which turned out to be normal.  She also complains of some bloating, but it comes and goes.  She has a history of underlying asthma, for which he takes Singulair 10 mg daily, Pulmicort one puff b.i.d.  She, states she's been coughing a lot.  She also has a history of reflux esophagitis, for which he takes Zantac one daily.  The Imitrex does not seem to be working well for her migraines.  She would like to discuss other options.  She Brooks having about two migraines per month.    Review of Systems 12 ROS of systems negative    Objective:   Physical Exam She Brooks a well-developed, light-skinned female, in no acute distress.  Examination the abdomen says that in the flat.  The bowel sounds are normal.  Others that tenderness.  No rebound.  Rectal examination normal.  Stool guaiac-negative.  There are 2 moles around her rectum.  They are flat and normal color.       Assessment & Plan:

## 2010-04-26 NOTE — Patient Instructions (Signed)
Experiment by staying on a lactose free diet for one week and avoiding any particular food that bothers you.  Also take Levsin one tab 4 times a day as needed for cramps and diarrhea.  Stop the Imitrex, and used the Zomig p.r.n. For migraines.  Continue current dose of the inhaled steroid within the next two weeks Add Prilosec  20 mg b.i.d.

## 2010-07-10 ENCOUNTER — Other Ambulatory Visit: Payer: Self-pay | Admitting: Obstetrics and Gynecology

## 2010-08-05 ENCOUNTER — Other Ambulatory Visit: Payer: Self-pay | Admitting: Obstetrics and Gynecology

## 2010-08-05 DIAGNOSIS — Z1231 Encounter for screening mammogram for malignant neoplasm of breast: Secondary | ICD-10-CM

## 2010-08-23 ENCOUNTER — Other Ambulatory Visit: Payer: Self-pay | Admitting: Family Medicine

## 2010-08-23 ENCOUNTER — Ambulatory Visit
Admission: RE | Admit: 2010-08-23 | Discharge: 2010-08-23 | Disposition: A | Discharge status: Home / Self Care | Payer: BC Managed Care – PPO | Source: Ambulatory Visit | Attending: Obstetrics and Gynecology | Admitting: Obstetrics and Gynecology

## 2010-08-23 DIAGNOSIS — Z1231 Encounter for screening mammogram for malignant neoplasm of breast: Secondary | ICD-10-CM

## 2010-09-16 ENCOUNTER — Other Ambulatory Visit (INDEPENDENT_AMBULATORY_CARE_PROVIDER_SITE_OTHER): Payer: BC Managed Care – PPO

## 2010-09-16 DIAGNOSIS — Z0000 Encounter for general adult medical examination without abnormal findings: Secondary | ICD-10-CM

## 2010-09-16 LAB — LIPID PANEL
Cholesterol: 218 mg/dL — ABNORMAL HIGH (ref 0–200)
Total CHOL/HDL Ratio: 5
Triglycerides: 188 mg/dL — ABNORMAL HIGH (ref 0.0–149.0)

## 2010-09-16 LAB — CBC WITH DIFFERENTIAL/PLATELET
Basophils Relative: 0.4 % (ref 0.0–3.0)
Eosinophils Relative: 1.5 % (ref 0.0–5.0)
HCT: 41 % (ref 36.0–46.0)
Hemoglobin: 13.9 g/dL (ref 12.0–15.0)
Lymphs Abs: 3.2 10*3/uL (ref 0.7–4.0)
MCV: 86.2 fl (ref 78.0–100.0)
Monocytes Absolute: 0.4 10*3/uL (ref 0.1–1.0)
Monocytes Relative: 4.5 % (ref 3.0–12.0)
Neutro Abs: 4.2 10*3/uL (ref 1.4–7.7)
RBC: 4.76 Mil/uL (ref 3.87–5.11)
WBC: 7.9 10*3/uL (ref 4.5–10.5)

## 2010-09-16 LAB — HEPATIC FUNCTION PANEL
ALT: 20 U/L (ref 0–35)
AST: 19 U/L (ref 0–37)
Total Bilirubin: 0.2 mg/dL — ABNORMAL LOW (ref 0.3–1.2)
Total Protein: 6.9 g/dL (ref 6.0–8.3)

## 2010-09-16 LAB — POCT URINALYSIS DIPSTICK
Bilirubin, UA: NEGATIVE
Blood, UA: NEGATIVE
Glucose, UA: NEGATIVE
Ketones, UA: NEGATIVE
Leukocytes, UA: NEGATIVE
Nitrite, UA: NEGATIVE

## 2010-09-16 LAB — BASIC METABOLIC PANEL
BUN: 14 mg/dL (ref 6–23)
Chloride: 103 mEq/L (ref 96–112)
GFR: 74.62 mL/min (ref >60.00)
Potassium: 4.7 mEq/L (ref 3.5–5.1)
Sodium: 138 mEq/L (ref 135–145)

## 2010-09-16 LAB — LDL CHOLESTEROL, DIRECT: Direct LDL: 141.9 mg/dL

## 2010-09-23 ENCOUNTER — Encounter: Payer: Self-pay | Admitting: Family Medicine

## 2010-09-23 ENCOUNTER — Ambulatory Visit (INDEPENDENT_AMBULATORY_CARE_PROVIDER_SITE_OTHER): Payer: BC Managed Care – PPO | Admitting: Family Medicine

## 2010-09-23 DIAGNOSIS — R51 Headache: Secondary | ICD-10-CM

## 2010-09-23 DIAGNOSIS — F411 Generalized anxiety disorder: Secondary | ICD-10-CM

## 2010-09-23 DIAGNOSIS — J45909 Unspecified asthma, uncomplicated: Secondary | ICD-10-CM

## 2010-09-23 MED ORDER — MONTELUKAST SODIUM 10 MG PO TABS: 10.0000 mg | ORAL_TABLET | Freq: Every day | ORAL | Status: DC

## 2010-09-23 MED ORDER — ZOLPIDEM TARTRATE 10 MG PO TABS: 10.0000 mg | ORAL_TABLET | Freq: Every evening | ORAL | Status: DC | PRN

## 2010-09-23 MED ORDER — BUDESONIDE 180 MCG/ACT IN AEPB: 1.0000 | INHALATION_SPRAY | Freq: Two times a day (BID) | RESPIRATORY_TRACT | Status: DC

## 2010-09-23 MED ORDER — DIAZEPAM 2 MG PO TABS: 2.0000 mg | ORAL_TABLET | Freq: Four times a day (QID) | ORAL | Status: DC | PRN

## 2010-09-23 MED ORDER — ZOLMITRIPTAN 2.5 MG PO TBDP: 2.5000 mg | ORAL_TABLET | ORAL | Status: DC | PRN

## 2010-09-23 MED ORDER — ALBUTEROL 90 MCG/ACT IN AERS: 2.0000 | INHALATION_SPRAY | Freq: Four times a day (QID) | RESPIRATORY_TRACT | Status: DC | PRN

## 2010-09-23 NOTE — Progress Notes (Signed)
  Subjective:    Patient ID: Blakleigh Straw, female    DOB: 29-Jul-1965, 45 y.o.   MRN: [id number]  HPI Royann Is a 45 year old, married female, nonsmoker, who comes in today for general physical examination because of a history of allergic rhinitis, asthma, panic attacks, IBS, reflux, esophagitis, migraine headaches.  Her asthma is under good control with Singulair 10 mg daily and Pulmicort one puff daily.  She takes Valium 2 mg p.r.n. For anxiety.  She takes Ambien 10 mg at bedtime as long as she does that.  She does not have panic attacks.  She takes sublingual Zomig, urine for migraines.  She takes Zantac 150 mg daily to prevent reflux esophagitis.  She is on BCPs dear GYN.  She takes Levsin .125 mg q.i.d. P.r.n. For IBS.  She has slight skin lines.  Eyes and has had a dysplastic nevus removed from her back.  Also do a thorough skin exam.  By GYN.  Recent mammogram normal.  History of fibrocystic changes   Review of Systems    Review of systems otherwise negative Objective:   Physical Exam    Well-developed well-nourished, female, in no acute distress.  Slight skin and light eyes.  HEENT negative.  Neck was supple.  Thyroid is not enlarged.  No adenopathy.  Lungs clear to auscultation.  Cardiac exam negative.  Breast exam shows diffuse fibrocystic changes throughout both breasts.  Abdominal exam was negative.  Pelvic via GYN.  Extremities normal.  Skin normal.  Peripheral pulses normal    Assessment & Plan:  Healthy female.  Asthma, under good control with Pulmicort one puff daily, and Singulair 10 daily.  History of panic attacks, Valium 2 mg p.r.n., along with 10 mg of Ambien at bedtime.  IBS continue Levsin q.i.d. P.r.n.  Reflux esophagitis.  Continue Zantac 150 daily.  Migraine headaches.  Continue Zomig p.r.n.

## 2010-09-23 NOTE — Patient Instructions (Signed)
Continue your current medications.  Follow-up in one year, sooner if any problem 

## 2010-09-24 ENCOUNTER — Encounter (HOSPITAL_COMMUNITY)
Admission: RE | Admit: 2010-09-24 | Discharge: 2010-09-24 | Disposition: A | Discharge status: Home / Self Care | Payer: BC Managed Care – PPO | Source: Ambulatory Visit | Attending: Obstetrics and Gynecology | Admitting: Obstetrics and Gynecology

## 2010-09-24 ENCOUNTER — Encounter (HOSPITAL_COMMUNITY): Payer: Self-pay

## 2010-09-24 HISTORY — DX: Anxiety disorder, unspecified: F41.9

## 2010-09-24 HISTORY — DX: Gastro-esophageal reflux disease without esophagitis: K21.9

## 2010-09-24 HISTORY — DX: Major depressive disorder, single episode, unspecified: F32.9

## 2010-09-24 HISTORY — DX: Headache: R51

## 2010-09-24 HISTORY — DX: Depression, unspecified: F32.A

## 2010-09-24 LAB — CBC
HCT: 42.6 % (ref 36.0–46.0)
Platelets: 331 10*3/uL (ref 150–400)
RBC: 4.94 MIL/uL (ref 3.87–5.11)
RDW: 13.3 % (ref 11.5–15.5)
WBC: 9.2 10*3/uL (ref 4.0–10.5)

## 2010-09-24 LAB — BASIC METABOLIC PANEL
BUN: 10 mg/dL (ref 6–23)
Calcium: 9.8 mg/dL (ref 8.4–10.5)
Creatinine, Ser: 0.98 mg/dL (ref 0.50–1.10)
GFR calc Af Amer: >60 mL/min (ref >60)
GFR calc non Af Amer: >60 mL/min (ref >60)

## 2010-09-24 LAB — PROTIME-INR
INR: 0.94 (ref 0.00–1.49)
Prothrombin Time: 12.8 seconds (ref 11.6–15.2)

## 2010-09-24 LAB — URINALYSIS, ROUTINE W REFLEX MICROSCOPIC
Nitrite: NEGATIVE
Specific Gravity, Urine: 1.02 (ref 1.005–1.030)
Urobilinogen, UA: 0.2 mg/dL (ref 0.0–1.0)
pH: 6 (ref 5.0–8.0)

## 2010-09-24 LAB — APTT: aPTT: 30 seconds (ref 24–37)

## 2010-09-24 NOTE — Patient Instructions (Signed)
Written inst given

## 2010-10-02 ENCOUNTER — Encounter (HOSPITAL_COMMUNITY): Payer: Self-pay | Admitting: Obstetrics and Gynecology

## 2010-10-02 NOTE — H&P (Signed)
Cheryl Brooks is an 45 y.o. female. Who desires sterilization for socioeconomic reasons  Pertinent Gynecological History: Menses: flow is moderate, regular every month without intermenstrual spotting and with minimal cramping Currently using Loestrin 24  Contraception: OCP (estrogen/progesterone)  Blood transfusions: none Previous surgery eye surgery 1972, Tonsilectomy 2007   Last mammogram: normal Date: 08/22/2010 Last pap: normal Date: 07/10/2010  OB History: G0, P0   Menstrual History: Menarche age: 12 No LMP recorded.    Past Medical History  Diagnosis Date  . Asthma   . Anxiety   . Depression   . GERD (gastroesophageal reflux disease)   . Headache     Past Surgical History  Procedure Date  . Tonsillectomy 2007  . Eye surgery 1972    No family history on file.  Social History:  reports that she has quit smoking. She does not have any smokeless tobacco history on file. She reports that she drinks about 1.2 ounces of alcohol per week. She reports that she does not use illicit drugs.  Allergies:  Allergies  Allergen Reactions  . Celexa Nausea And Vomiting    No prescriptions prior to admission    Review of Systems  Constitutional: Negative.   HENT: Negative.   Eyes: Negative.   Respiratory: Negative.   Cardiovascular: Negative.   Gastrointestinal: Negative.   Genitourinary: Negative.   Musculoskeletal: Negative.   Skin: Negative.   Neurological: Negative.   Endo/Heme/Allergies: Negative.   Psychiatric/Behavioral: Negative.     There were no vitals taken for this visit. Physical Exam  Constitutional: She is oriented to person, place, and time. She appears well-developed and well-nourished.  HENT:  Head: Normocephalic and atraumatic.  Eyes: Pupils are equal, round, and reactive to light.  Neck: Neck supple. No tracheal deviation present. No thyromegaly present.  Cardiovascular: Normal rate, regular rhythm and normal heart sounds.   Respiratory:  Effort normal and breath sounds normal.  GI: Soft. Bowel sounds are normal. She exhibits no distension and no mass. There is no tenderness. There is no rebound and no guarding.  Genitourinary: Vagina normal and uterus normal.  Musculoskeletal: Normal range of motion.  Lymphadenopathy:    She has no cervical adenopathy.  Neurological: She is alert and oriented to person, place, and time.  Skin: Skin is warm.  Psychiatric: She has a normal mood and affect. Her behavior is normal.    No results found for this or any previous visit (from the past 24 hour(s)).  No results found.  Assessment/Plan: Normal female desiring sterilization. Risks and benefits of procedure discussed with patient. Will procedure with Laparoscopic tubal sterilization.  Yury Schaus 10/02/2010, 7:26 PM

## 2010-10-03 ENCOUNTER — Encounter (HOSPITAL_COMMUNITY)
Admission: RE | Disposition: A | Discharge status: Home / Self Care | Payer: Self-pay | Source: Ambulatory Visit | Attending: Obstetrics and Gynecology

## 2010-10-03 ENCOUNTER — Ambulatory Visit (HOSPITAL_COMMUNITY)
Admission: RE | Admit: 2010-10-03 | Discharge: 2010-10-03 | Disposition: A | Discharge status: Home / Self Care | Payer: BC Managed Care – PPO | Source: Ambulatory Visit | Attending: Obstetrics and Gynecology | Admitting: Obstetrics and Gynecology

## 2010-10-03 ENCOUNTER — Encounter (HOSPITAL_COMMUNITY): Payer: Self-pay | Admitting: Anesthesiology

## 2010-10-03 ENCOUNTER — Ambulatory Visit (HOSPITAL_COMMUNITY): Payer: BC Managed Care – PPO | Admitting: Anesthesiology

## 2010-10-03 DIAGNOSIS — Z302 Encounter for sterilization: Secondary | ICD-10-CM | POA: Insufficient documentation

## 2010-10-03 DIAGNOSIS — Z01818 Encounter for other preprocedural examination: Secondary | ICD-10-CM | POA: Insufficient documentation

## 2010-10-03 DIAGNOSIS — Z01812 Encounter for preprocedural laboratory examination: Secondary | ICD-10-CM | POA: Insufficient documentation

## 2010-10-03 HISTORY — PX: LAPAROSCOPIC TUBAL LIGATION: SHX1937

## 2010-10-03 SURGERY — LIGATION, FALLOPIAN TUBE, LAPAROSCOPIC
Anesthesia: General | Site: Abdomen | Laterality: Bilateral | Wound class: Clean Contaminated

## 2010-10-03 MED ORDER — LACTATED RINGERS IV SOLN
INTRAVENOUS | Status: DC
  Administered 2010-10-03: 11:00:00 via INTRAVENOUS

## 2010-10-03 MED ORDER — FENTANYL CITRATE 0.05 MG/ML IJ SOLN
INTRAMUSCULAR | Status: DC | PRN
  Administered 2010-10-03: 100 ug via INTRAVENOUS

## 2010-10-03 MED ORDER — PROPOFOL 10 MG/ML IV EMUL
INTRAVENOUS | Status: DC | PRN
  Administered 2010-10-03: 160 mg via INTRAVENOUS

## 2010-10-03 MED ORDER — DEXAMETHASONE SODIUM PHOSPHATE 10 MG/ML IJ SOLN
INTRAMUSCULAR | Status: DC | PRN
  Administered 2010-10-03: 10 mg via INTRAVENOUS

## 2010-10-03 MED ORDER — CEFAZOLIN SODIUM 1-5 GM-% IV SOLN
1.0000 g | Freq: Once | INTRAVENOUS | Status: AC
  Administered 2010-10-03: 1 g via INTRAVENOUS

## 2010-10-03 MED ORDER — SUCCINYLCHOLINE CHLORIDE 20 MG/ML IJ SOLN
INTRAMUSCULAR | Status: AC
  Filled 2010-10-03: qty 1

## 2010-10-03 MED ORDER — ROCURONIUM BROMIDE 100 MG/10ML IV SOLN
INTRAVENOUS | Status: DC | PRN
  Administered 2010-10-03: 5 mg via INTRAVENOUS

## 2010-10-03 MED ORDER — KETOROLAC TROMETHAMINE 30 MG/ML IJ SOLN
INTRAMUSCULAR | Status: AC
  Filled 2010-10-03: qty 1

## 2010-10-03 MED ORDER — CEFAZOLIN SODIUM 1-5 GM-% IV SOLN
INTRAVENOUS | Status: AC
  Filled 2010-10-03: qty 50

## 2010-10-03 MED ORDER — BUPIVACAINE HCL (PF) 0.25 % IJ SOLN
INTRAMUSCULAR | Status: DC | PRN
  Administered 2010-10-03: 10 mL

## 2010-10-03 MED ORDER — LIDOCAINE HCL (CARDIAC) 10 MG/ML IV SOLN
INTRAVENOUS | Status: DC | PRN
  Administered 2010-10-03: 50 mg via INTRAVENOUS

## 2010-10-03 MED ORDER — ONDANSETRON HCL 4 MG/2ML IJ SOLN
INTRAMUSCULAR | Status: AC
  Filled 2010-10-03: qty 2

## 2010-10-03 MED ORDER — ROCURONIUM BROMIDE 50 MG/5ML IV SOLN
INTRAVENOUS | Status: AC
  Filled 2010-10-03: qty 1

## 2010-10-03 MED ORDER — ONDANSETRON HCL 4 MG/2ML IJ SOLN
INTRAMUSCULAR | Status: DC | PRN
  Administered 2010-10-03: 4 mg via INTRAVENOUS

## 2010-10-03 MED ORDER — SUCCINYLCHOLINE CHLORIDE 20 MG/ML IJ SOLN
INTRAMUSCULAR | Status: DC | PRN
  Administered 2010-10-03: 100 mg via INTRAVENOUS

## 2010-10-03 MED ORDER — HYDROCODONE-ACETAMINOPHEN 5-325 MG PO TABS
ORAL_TABLET | ORAL | Status: AC
  Administered 2010-10-03: 1 via ORAL
  Filled 2010-10-03: qty 1

## 2010-10-03 MED ORDER — MIDAZOLAM HCL 2 MG/2ML IJ SOLN
INTRAMUSCULAR | Status: AC
  Filled 2010-10-03: qty 2

## 2010-10-03 MED ORDER — LIDOCAINE HCL (CARDIAC) 20 MG/ML IV SOLN
INTRAVENOUS | Status: AC
  Filled 2010-10-03: qty 5

## 2010-10-03 MED ORDER — MIDAZOLAM HCL 5 MG/5ML IJ SOLN
INTRAMUSCULAR | Status: DC | PRN
  Administered 2010-10-03: 2 mg via INTRAVENOUS

## 2010-10-03 MED ORDER — FENTANYL CITRATE 0.05 MG/ML IJ SOLN
INTRAMUSCULAR | Status: AC
  Filled 2010-10-03: qty 5

## 2010-10-03 MED ORDER — KETOROLAC TROMETHAMINE 30 MG/ML IJ SOLN
INTRAMUSCULAR | Status: DC | PRN
  Administered 2010-10-03: 30 mg via INTRAVENOUS

## 2010-10-03 MED ORDER — DEXAMETHASONE SODIUM PHOSPHATE 10 MG/ML IJ SOLN
INTRAMUSCULAR | Status: AC
  Filled 2010-10-03: qty 1

## 2010-10-03 MED ORDER — PROPOFOL 10 MG/ML IV EMUL
INTRAVENOUS | Status: AC
  Filled 2010-10-03: qty 20

## 2010-10-03 SURGICAL SUPPLY — 16 items
CATH ROBINSON RED A/P 16FR (CATHETERS) ×2 IMPLANT
CLOTH BEACON ORANGE TIMEOUT ST (SAFETY) ×2 IMPLANT
DERMABOND ADVANCED (GAUZE/BANDAGES/DRESSINGS) ×2 IMPLANT
GLOVE BIO SURGEON STRL SZ7 (GLOVE) ×4 IMPLANT
GOWN BRE IMP SLV AUR LG STRL (GOWN DISPOSABLE) ×4 IMPLANT
GOWN BRE IMP SLV SIRUS LXLNG (GOWN DISPOSABLE) ×2 IMPLANT
PACK LAPAROSCOPY BASIN (CUSTOM PROCEDURE TRAY) ×2 IMPLANT
SHEET LAVH (DRAPES) IMPLANT
STRIP CLOSURE SKIN 1/4X3 (GAUZE/BANDAGES/DRESSINGS) ×2 IMPLANT
SUT VIC AB 2-0 UR5 27 (SUTURE) ×2 IMPLANT
SUT VICRYL RAPIDE 3 0 (SUTURE) ×2 IMPLANT
SYR 5ML LL (SYRINGE) ×2 IMPLANT
TOWEL OR 17X24 6PK STRL BLUE (TOWEL DISPOSABLE) ×4 IMPLANT
TROCAR Z-THREAD BLADED 11X100M (TROCAR) ×2 IMPLANT
WARMER LAPAROSCOPE (MISCELLANEOUS) ×2 IMPLANT
WATER STERILE IRR 1000ML POUR (IV SOLUTION) IMPLANT

## 2010-10-03 NOTE — Anesthesia Preprocedure Evaluation (Signed)
Anesthesia Evaluation  Name, MR# and DOB Patient awake  General Assessment Comment  Airway Mallampati: II TM Distance: >3 FB Neck ROM: Full    Dental  (+) Teeth Intact   Pulmonaryneg pulmonary ROS    clear to auscultation  pulmonary exam normal   Cardiovascular Regular Normal   Neuro/Psych  GI/Hepatic/Renal negative GI ROS, negative Liver ROS, and negative Renal ROS (+)       Endo/Other  Negative Endocrine ROS (+)   Abdominal Normal abdominal exam  (+)   Musculoskeletal negative musculoskeletal ROS (+)  Hematology negative hematology ROS (+)   Peds  Reproductive/Obstetrics negative OB ROS   Anesthesia Other Findings             Anesthesia Physical Anesthesia Plan  ASA: II  Anesthesia Plan: General   Post-op Pain Management:    Induction: Intravenous  Airway Management Planned: Oral ETT  Additional Equipment:   Intra-op Plan:   Post-operative Plan: Extubation in OR  Informed Consent: I have reviewed the patients History and Physical, chart, labs and discussed the procedure including the risks, benefits and alternatives for the proposed anesthesia with the patient or authorized representative who has indicated his/her understanding and acceptance.   Dental advisory given  Plan Discussed with: Anesthesiologist (AP) and CRNA  Anesthesia Plan Comments:         Anesthesia Quick Evaluation

## 2010-10-03 NOTE — Anesthesia Procedure Notes (Signed)
Procedures

## 2010-10-03 NOTE — Transfer of Care (Signed)
Immediate Anesthesia Transfer of Care Note  Patient: Cheryl Brooks  Procedure(s) Performed:  LAPAROSCOPIC TUBAL LIGATION  Patient Location: PACU  Anesthesia Type: General  Level of Consciousness: awake, alert , oriented and patient cooperative  Airway & Oxygen Therapy: Patient Spontanous Breathing and Patient connected to nasal cannula oxygen  Post-op Assessment: Report given to PACU RN, Post -op Vital signs reviewed and stable and Patient moving all extremities X 4  Post vital signs: Reviewed and stable  Complications: No apparent anesthesia complications

## 2010-10-03 NOTE — Anesthesia Postprocedure Evaluation (Signed)
  Anesthesia Post-op Note  Patient: Cheryl Brooks  Procedure(s) Performed:  LAPAROSCOPIC TUBAL LIGATION  Patient Location: PACU  Anesthesia Type: General  Level of Consciousness: awake, alert  and oriented  Airway and Oxygen Therapy: Patient Spontanous Breathing  Post-op Pain: none  Post-op Assessment: Post-op Vital signs reviewed, Patient's Cardiovascular Status Stable, Respiratory Function Stable, Patent Airway, No signs of Nausea or vomiting and Adequate PO intake  Post-op Vital Signs: Reviewed and stable  Complications: No apparent anesthesia complications

## 2010-10-04 NOTE — Op Note (Signed)
Cheryl Brooks, BEGEMAN NO.:  [id number]  MEDICAL RECORD NO.:  [id number]  LOCATION:  [facility]                          FACILITY:  [facility]  PHYSICIAN:  Miguel Aschoff, M.D.       DATE OF BIRTH:  07-17-1965  DATE OF PROCEDURE:  10/03/2010 DATE OF DISCHARGE:  10/03/2010                              OPERATIVE REPORT   PREOPERATIVE DIAGNOSIS:  Desired sterilization.  POSTOPERATIVE DIAGNOSIS:  Desired sterilization.  PROCEDURE:  Bilateral laparoscopic tubal sterilization using cautery and division.  SURGEON:  Miguel Aschoff, MD  ANESTHESIA:  General.  COMPLICATIONS:  None.  JUSTIFICATION:  The patient is a 45 year old white female requesting permanent sterilization for socioeconomic reasons.  The patient understands risks, limitation of this procedure, understands that this should be resulting in her sterility, but that this cannot be guaranteed and that there is 1 in 300 failure rate.  The patient understands the risk of the procedure with injury to adjacent structures.  Informed consent has been obtained.  PROCEDURE IN DETAIL:  The patient was taken to the operating room, placed in supine position.  General anesthesia was administered without difficulty.  She was then placed in the dorsal lithotomy position, prepped and draped in usual sterile fashion.  Once this was done, the bladder was catheterized.  Hulka tenaculum was placed to the cervix and held.  Prior to placing the Hulka clamp, examination revealed normal external genitalia, normal Bartholin and Skene glans, normal urethra. The vaginal vault was without gross lesion.  The cervix was without gross lesion.  Uterus was noted to be anterior, normal size and shape. No adnexal masses were noted.  Once the Hulka tenaculum was in place, attention was directed to the umbilicus where a small infraumbilical incision was made.  A Veress needle was then inserted and the abdomen was insufflated with 3 L of CO2.  Following the  insufflation, the trocar to laparoscope was placed followed by laparoscope itself.  Inspection at this point revealed normal anterior bladder peritoneum.  The round ligaments were unremarkable.  The uterus was noted to be anterior, normal size and shape.  The tubes were normal along their course.  The fimbriae were fine and delicate.  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  No abnormalities were noted in the cul-de-sac. Intestinal surfaces appeared to be within normal limits.  The liver was unremarkable.  Gallbladder was visualized and appeared to be uninflamed. At this point, through the operating channel of the laparoscope, bipolar cautery, forceps were introduced.  The midportion of each tube was grasped and then cauterized for approximately 3 cm in the midline.  Then using laparoscopic scissors, the tubes were divided with good separation and good hemostasis.  At this point, with no other abnormalities being noted, CO2 was allowed to escape.  All instruments were removed.  The small incisions were then closed using subcuticular 3-0 Vicryl.  The port site was injected with 10 mL of 0.25% Marcaine for postop analgesia.  The patient was taken out of lithotomy  position, brought to the recovery room in satisfactory condition.  The blood loss was minimal.  Plan is for the patient to be discharged home.  Medications for home include Vicodin 1 every 3 hours if needed for pain. The patient was instructed to resume all her other medications except for birth control pills.  She is to call if there are any problems such as fever, pain, or heavy bleeding.  She will be seen back in 4 weeks for followup examination.     Miguel Aschoff, M.D.     AR/MEDQ  D:  10/03/2010  T:  10/03/2010  Job:  [id number]

## 2010-10-22 ENCOUNTER — Encounter (HOSPITAL_COMMUNITY): Payer: Self-pay | Admitting: Obstetrics and Gynecology

## 2010-12-31 ENCOUNTER — Ambulatory Visit (INDEPENDENT_AMBULATORY_CARE_PROVIDER_SITE_OTHER): Payer: BC Managed Care – PPO | Admitting: Family Medicine

## 2010-12-31 ENCOUNTER — Encounter: Payer: Self-pay | Admitting: Family Medicine

## 2010-12-31 DIAGNOSIS — J45909 Unspecified asthma, uncomplicated: Secondary | ICD-10-CM

## 2010-12-31 DIAGNOSIS — J309 Allergic rhinitis, unspecified: Secondary | ICD-10-CM

## 2010-12-31 NOTE — Progress Notes (Signed)
  Subjective:    Patient ID: Cheryl Brooks, female    DOB: 1965/09/01, 45 y.o.   MRN: [id number]  HPI Cheryl Brooks is a 45 year old female, who comes in today for evaluation of a cough for one week.  She has a history of allergic rhinitis and asthma and is taking her medication on a daily basis.  Despite, that she's been coughing for a week.  No fever no sputum production  Review of Systems    General and pulmonary review of systems otherwise negative Objective:   Physical Exam  Well-developed well-nourished, female, in no acute distress.  Examination of the HEENT were negative.  The neck was supple.  No adenopathy.  Lungs are clear.  No wheezing      Assessment & Plan:  Allergic rhinitis.  Plan continue current therapy.  Return p.r.n.

## 2010-12-31 NOTE — Patient Instructions (Signed)
Continue current medications return p.r.n.

## 2011-02-07 ENCOUNTER — Other Ambulatory Visit: Payer: Self-pay | Admitting: Family Medicine

## 2011-03-31 ENCOUNTER — Telehealth: Payer: Self-pay | Admitting: Family Medicine

## 2011-03-31 NOTE — Telephone Encounter (Signed)
Pt requesting refill on ANUSOL-HC 25 MG  SUPP (HYDROCORTISONE ACETATE)  [facility] [place]

## 2011-04-01 ENCOUNTER — Other Ambulatory Visit: Payer: Self-pay | Admitting: *Deleted

## 2011-04-01 DIAGNOSIS — F411 Generalized anxiety disorder: Secondary | ICD-10-CM

## 2011-04-01 MED ORDER — ZOLPIDEM TARTRATE 10 MG PO TABS: 10.0000 mg | ORAL_TABLET | Freq: Every evening | ORAL | Status: DC | PRN

## 2011-04-01 MED ORDER — DIAZEPAM 2 MG PO TABS: 2.0000 mg | ORAL_TABLET | Freq: Four times a day (QID) | ORAL | Status: DC | PRN

## 2011-04-07 NOTE — Telephone Encounter (Signed)
Pt called back to check on status of cream and to add to this message, she complaining of a migraine that has lasted for 2 weeks. Wants a call back from [place]. Thanks.

## 2011-04-08 NOTE — Telephone Encounter (Signed)
Spoke with patient and an appointment made 

## 2011-04-09 ENCOUNTER — Encounter: Payer: Self-pay | Admitting: Family Medicine

## 2011-04-09 ENCOUNTER — Ambulatory Visit (INDEPENDENT_AMBULATORY_CARE_PROVIDER_SITE_OTHER): Payer: BC Managed Care – PPO | Admitting: Family Medicine

## 2011-04-09 DIAGNOSIS — K602 Anal fissure, unspecified: Secondary | ICD-10-CM

## 2011-04-09 DIAGNOSIS — R51 Headache: Secondary | ICD-10-CM

## 2011-04-09 MED ORDER — PREDNISONE 20 MG PO TABS: ORAL_TABLET | ORAL | Status: DC

## 2011-04-09 MED ORDER — KETOROLAC TROMETHAMINE 60 MG/2ML IM SOLN
60.0000 mg | Freq: Four times a day (QID) | INTRAMUSCULAR | Status: AC | PRN
  Administered 2011-04-09: 60 mg via INTRAMUSCULAR

## 2011-04-09 MED ORDER — HYDROCORTISONE ACETATE 25 MG RE SUPP: 25.0000 mg | Freq: Two times a day (BID) | RECTAL | Status: AC

## 2011-04-09 NOTE — Patient Instructions (Signed)
Rest at home today and use the prednisone as directed.  Return on Monday or Tuesday to discuss other options going forward

## 2011-04-09 NOTE — Progress Notes (Signed)
  Subjective:    Patient ID: Cheryl Brooks, female    DOB: Jul 04, 1965, 46 y.o.   MRN: [id number]  HPI Bella is a 46 year old female, nonsmoker, who comes in today because of a migraine that won't go away, and rectal fissure.  She has episodic migraines, and they usually are controlled with either Zomig or Vicodin and Zometa.  However, she began having a migraine two weeks ago, and it won't go away.  She states the headaches.  The is minimal in the morning usually it two or 3 but by the 4 o'clock in the afternoon.  He becomes very severe.  No neurologic symptoms.  She also has a history of a rectal fissure and is starting to become irritated.  Again.  We treat her with the Anusol-HC suppositories and stool softeners.   Review of Systems    General and neurologic and GI review of systems otherwise negative Objective:   Physical Exam  Well-developed well-nourished, female, in no acute distress.  HEENT were normalNerve exam, negative      Assessment & Plan:  Cluster migraine.  Plan prednisone a burst and taper her Toradol 60 IM now rests at home.  Return next week for follow-up to discuss prophylactic treatment.  History of rectal fissure.  Refill the Anusol suppositories

## 2011-04-14 ENCOUNTER — Ambulatory Visit (INDEPENDENT_AMBULATORY_CARE_PROVIDER_SITE_OTHER): Payer: BC Managed Care – PPO | Admitting: Family Medicine

## 2011-04-14 ENCOUNTER — Encounter: Payer: Self-pay | Admitting: Family Medicine

## 2011-04-14 VITALS — BP 100/70 | Temp 98.4°F | Wt 149.0 lb

## 2011-04-14 DIAGNOSIS — R51 Headache: Secondary | ICD-10-CM

## 2011-04-14 MED ORDER — OXYCODONE-ACETAMINOPHEN 5-325 MG PO TABS: 1.0000 | ORAL_TABLET | Freq: Three times a day (TID) | ORAL | Status: AC | PRN

## 2011-04-14 MED ORDER — TOPIRAMATE 25 MG PO TABS: 25.0000 mg | ORAL_TABLET | Freq: Two times a day (BID) | ORAL | Status: DC

## 2011-04-14 NOTE — Patient Instructions (Signed)
Tapered the prednisone,,,,,,,, starting tomorrow morning a half a tablet daily for 3 days, then half a tab every other day for two weeks.  Topamax 25 mg twice daily with food.  Motrin 400 mg 3 times daily during the day.  Percocet one tablet at bedtime.  Return Thursday for follow-up

## 2011-04-14 NOTE — Progress Notes (Signed)
  Subjective:    Patient ID: Cheryl Brooks, female    DOB: July 22, 1965, 46 y.o.   MRN: [id number]  HPI Cheryl Brooks is a 46 year old female, nonsmoker, who comes in today for follow-up of a cluster migraine headache.  She has had this problem in the past and at [facility] time [facility].  They tried different medications, but nothing seemed to help her as headaches, been present now for about 10 days.  We start her on 60 mg of prednisone with a taper and the prednisone is not stopped.  Her headache.  Her headache is constant.  A4 on a scale of one to 10 however, she continues to work.  We discussed her his options.  We will begin to taper the prednisone switched to a long acting more potent pain medication.  Add Topamax, and follow-up in 3 days   Review of Systems    General and neurologic review of systems otherwise negative Objective:   Physical Exam  Well-developed well-nourished, female, in no acute distress, except the lights are out, because she has photophobia from the migraine      Assessment & Plan:  Cluster migraine.  Plan Taper prednisone, and Topamax, and Percocet.  Follow-up in 3 days for

## 2011-04-16 ENCOUNTER — Telehealth: Payer: Self-pay | Admitting: Family Medicine

## 2011-04-16 NOTE — Telephone Encounter (Signed)
Pt was unable to go to work today due to topamax is  making her whoozy. Pt stop taking med this morning. Pt has ov tomorrow. FYI

## 2011-04-17 ENCOUNTER — Encounter: Payer: Self-pay | Admitting: Family Medicine

## 2011-04-17 ENCOUNTER — Ambulatory Visit (INDEPENDENT_AMBULATORY_CARE_PROVIDER_SITE_OTHER): Payer: BC Managed Care – PPO | Admitting: Family Medicine

## 2011-04-17 VITALS — BP 110/80 | Temp 98.4°F

## 2011-04-17 DIAGNOSIS — R51 Headache: Secondary | ICD-10-CM

## 2011-04-17 MED ORDER — TIZANIDINE HCL 4 MG PO TABS: ORAL_TABLET | ORAL | Status: DC

## 2011-04-17 NOTE — Patient Instructions (Signed)
Increase the prednisone to one tablet daily.  Zanaflex one tablet twice daily.  Dr. Denton Meek will call you to get u  set up to see them.,,,,,,,,,,,  This coming Thursday

## 2011-04-17 NOTE — Progress Notes (Signed)
  Subjective:    Patient ID: Cheryl Brooks, female    DOB: Mar 17, 1966, 46 y.o.   MRN: [id number]  HPI [name] is a 46 year old female, married, nonsmoker, who comes in today for follow-up of a cluster migraine.  We have been working with her over the past 3 weeks.  Her migraine is persistent.  She states the morning, when she wakes up her pain is about a one.  Around noon.  It starts, and it's about a 4 and then around the evening time is about a 6.  Again, no neurologic symptoms.  We gave her a short course of prednisone 60 mg then 40 x 3 days with a taper that did not help.  She is now down to a half a tablet every other day and prednisone.  We also tried some Topamax, however, that made her nauseated.  Today I called [name], and discussed her case with him.  He recommended we try Zanaflex 4 mg b.i.d. And he would see her this coming Thursday.   Review of Systems General and neurologic review of systems otherwise negative    Objective:   Physical Exam Well-developed well-nourished, female, in no acute distress, except she is sitting in the room with the lights off because she has photophobia.  Pain now is about a 5       Assessment & Plan:  Cluster migraine,,,,,,,,,,,,, continue to taper prednisone slowly, Zanaflex 4 mg b.i.d., neurologic consult with Dr. [name]  on Thursday

## 2011-04-24 ENCOUNTER — Ambulatory Visit (INDEPENDENT_AMBULATORY_CARE_PROVIDER_SITE_OTHER): Payer: BC Managed Care – PPO | Admitting: Neurology

## 2011-04-24 ENCOUNTER — Encounter: Payer: Self-pay | Admitting: Neurology

## 2011-04-24 ENCOUNTER — Other Ambulatory Visit: Payer: Self-pay | Admitting: *Deleted

## 2011-04-24 VITALS — BP 114/74 | HR 72 | Ht 67.5 in | Wt 145.0 lb

## 2011-04-24 DIAGNOSIS — J45909 Unspecified asthma, uncomplicated: Secondary | ICD-10-CM

## 2011-04-24 DIAGNOSIS — R51 Headache: Secondary | ICD-10-CM

## 2011-04-24 MED ORDER — PROCHLORPERAZINE MALEATE 10 MG PO TABS: 10.0000 mg | ORAL_TABLET | Freq: Four times a day (QID) | ORAL | Status: AC | PRN

## 2011-04-24 MED ORDER — MONTELUKAST SODIUM 10 MG PO TABS: 10.0000 mg | ORAL_TABLET | Freq: Every day | ORAL | Status: DC

## 2011-04-24 MED ORDER — SUMATRIPTAN SUCCINATE 100 MG PO TABS: 100.0000 mg | ORAL_TABLET | Freq: Once | ORAL | Status: DC | PRN

## 2011-04-24 NOTE — Progress Notes (Signed)
Dear Dr. Tawanna Cooler,  Thank you for having me see Cheryl Brooks in consultation today at [facility] for her problem with severe headaches.  As you may recall, she is a 46 y.o. year old female with a history of severe headaches who has had a persistent headache for the last month. In fact, she has had frequent severe headaches since September.  She is a long history of severe headaches. Typically, these occurred about every 3 or 4 months. They were bifrontal with photo and phonophobia. Sometimes they are preceded by a visual aura of blurriness of peripheral vision. They typically last up to 2 weeks. They're described as "steady pain". She does not vomit although does get nausea. In the past she's used Maxalt which was unsuccessful, Imitrex which worked but was accompanied by worsening nausea and most recently Zomig.  Interestingly, she was just taken off her birth control around the same time as she had worsening of her headaches.  This current headache is slightly different than her previous headaches. It is more posterior with head pain in her occiput. You gave her a prednisone taper which was unsuccessful. Topamax 25 mg twice a day made her "sick". We recently tried Zanaflex 4 mg twice a day which has helped to alleviate the headache although it is not completely gone.  In addition before this worsening of her headaches she gets dull headaches almost every day that are mainly occipital.  She is never noted a relationship between her headaches and her menstrual cycle.  She also gets vertigo and has had this for many years. He can be brought on by short movements of the head. It only lasts seconds. He's been worse during this headache.  The patient also complains of frequent anxiety attacks sometimes up to 30 at night. These can occur during the day as well. She has a feeling of pounding in her chest. They can be brought on in crowds.    Past Medical History  Diagnosis Date  . Asthma   .  Anxiety   . Depression   . GERD (gastroesophageal reflux disease)   . Headache     Past Surgical History  Procedure Date  . Tonsillectomy 2007  . Eye surgery 1972  . Laparoscopic tubal ligation 10/03/2010    Procedure: LAPAROSCOPIC TUBAL LIGATION;  Surgeon: Miguel Aschoff;  Location: [facility];  Service: Gynecology;  Laterality: Bilateral;    History   Social History  . Marital Status: Legally Separated    Spouse Name: N/A    Number of Children: N/A  . Years of Education: N/A   Social History Main Topics  . Smoking status: Former Games developer  . Smokeless tobacco: Never Used   Comment: only smoked 6 months while in college.  . Alcohol Use: 1.2 oz/week    1 Glasses of wine, 1 Shots of liquor per week  . Drug Use: No  . Sexually Active: None   Other Topics Concern  . None   Social History Narrative  . None    No family history on file.  Current Outpatient Prescriptions on File Prior to Visit  Medication Sig Dispense Refill  . albuterol (PROVENTIL,VENTOLIN) 90 MCG/ACT inhaler Inhale 2 puffs into the lungs every 6 (six) hours as needed.  17 g  2  . aspirin 81 MG tablet Take 81 mg by mouth daily.        . budesonide (PULMICORT) 180 MCG/ACT inhaler Inhale 1 puff into the lungs 2 (two) times daily.  2 Inhaler  3  . diazepam (VALIUM) 2 MG tablet Take 1 tablet (2 mg total) by mouth every 6 (six) hours as needed for anxiety. Twice daily as needed  30 tablet  5  . HYDROcodone-acetaminophen (VICODIN ES) 7.5-750 MG per tablet TAKE  1/2 - 1 TABLET BY MOUTH EVERY 4 HOURS AS NEEDED FOR MIGRAINE  90 tablet  0  . oxyCODONE-acetaminophen (ROXICET) 5-325 MG per tablet Take 1 tablet by mouth every 8 (eight) hours as needed for pain.  40 tablet  0  . predniSONE (DELTASONE) 20 MG tablet 3 tabs now then starting tomorrow two tabs x 3 days, one tab x 3 days, a half a tab x 3 days, then a half a tablEvery other day for two weeks  40 tablet  1  . tiZANidine (ZANAFLEX) 4 MG tablet One p.o. B.i.d.  30 tablet   1  . topiramate (TOPAMAX) 25 MG tablet Take 1 tablet (25 mg total) by mouth 2 (two) times daily.  60 tablet  11  . zolmitriptan (ZOMIG ZMT) 2.5 MG disintegrating tablet Take 1 tablet (2.5 mg total) by mouth as needed for migraine.  10 tablet  11  . zolpidem (AMBIEN) 10 MG tablet Take 1 tablet (10 mg total) by mouth at bedtime as needed.  30 tablet  5  . hyoscyamine (LEVSIN SL) 0.125 MG SL tablet Take 0.125 mg by mouth every 4 (four) hours as needed.      Marland Kitchen DISCONTD: montelukast (SINGULAIR) 10 MG tablet Take 1 tablet (10 mg total) by mouth at bedtime.  100 tablet  3  - sister with headache  Allergies  Allergen Reactions  . Celexa Nausea And Vomiting      ROS:  13 systems were reviewed and are notable for history of anxiety.  All other review of systems are unremarkable.   Examination:  Filed Vitals:   04/24/11 1204  BP: 114/74  Pulse: 72  Height: 5' 7.5" (1.715 m)  Weight: 145 lb (65.772 kg)     In general, well appearing women.  Cardiovascular: The patient has a regular rate and rhythm and no carotid bruits.  Fundoscopy:  Disks are flat. Vessel caliber within normal limits.  Mental status:   The patient is oriented to person, place and time. Recent and remote memory are intact. Attention span and concentration are normal. Language including repetition, naming, following commands are intact. Fund of knowledge of current and historical events, as well as vocabulary are normal.  Cranial Nerves: Pupils are equally round and reactive to light. Visual fields full to confrontation. Extraocular movements are intact without nystagmus. Facial sensation and muscles of mastication are intact. Muscles of facial expression are symmetric. Hearing intact to bilateral finger rub. Tongue protrusion, uvula, palate midline.  Shoulder shrug intact  Motor:  The patient has normal bulk and tone, no pronator drift.  There are no adventitious movements.  5/5 bilaterally.  Reflexes:    Biceps  Triceps Brachioradialis Knee Ankle  Right 2+  2+  2+   2+ 2+  Left  2+  2+  2+   2+ 2+  Toes down  Coordination:  Normal finger to nose.  No dysdiadokinesia.  Sensation is intact to temperature and vibration.  Gait and Station are normal.  Tandem gait is intact.  Romberg is negative  Vestibular: - dix/hallpike; - head thrust  Impression:  1.  Status migrainosus, resolving.  Her migraines have worsened since she stopped her OCP.  I have advised her to start it again.  In addition I have advised her returning to using Imitrex as an abortive with Compazine for nausea as it seems to have been the most effective.  She will keep taking the zanaflex until this current exacerbation is gone.  It seems to have helped and we can use this in the future. 2.  Vertigo - It sounds like BPPV, and could be exacerbated by her current headaches.  We will continue to monitor this. 3.  "Anxiety attacks" - I would recommend at least a Holter monitor to ensure these are not due to some tachyarrhythmia because it is unusual to have 30 anxiety attacks in a row.  However, a trial of another SSRI may be helpful if they are clearly psychogenic.  However, I will leave this up to you.   We will see the patient back in 3 months.  Thank you for having [place] see Avanna Sowder in consultation.  Feel free to contact me with any questions.  Lupita Raider Modesto Charon, MD [facility], [place] [address] [place], [place] [address] Phone: [phone] Fax: [phone].

## 2011-05-20 ENCOUNTER — Ambulatory Visit (INDEPENDENT_AMBULATORY_CARE_PROVIDER_SITE_OTHER): Payer: BC Managed Care – PPO | Admitting: Family Medicine

## 2011-05-20 ENCOUNTER — Encounter: Payer: Self-pay | Admitting: Family Medicine

## 2011-05-20 DIAGNOSIS — R51 Headache: Secondary | ICD-10-CM

## 2011-05-20 DIAGNOSIS — J45909 Unspecified asthma, uncomplicated: Secondary | ICD-10-CM

## 2011-05-20 MED ORDER — HYDROCODONE-HOMATROPINE 5-1.5 MG/5ML PO SYRP: ORAL_SOLUTION | ORAL | Status: DC

## 2011-05-20 NOTE — Progress Notes (Signed)
  Subjective:    Patient ID: Cynai Skeens, female    DOB: 03-09-1966, 46 y.o.   MRN: [id number]  HPI Josphine is a delightful 46 year old female who comes in today for evaluation of 2 issues  We had seen her over the last couple months with a flareup in her migraine headaches and were unable to get her migraines to quiet down. We therefore referred her for a consult with Dr. Denton Meek. He started on Zanaflex 4 mg twice a day and BCPs continuously. They had to stop the Topamax because she had side effects. Migraines are now quiet.  She takes Pulmicort one puff twice a day for chronic asthma and 4 days ago developed a cold which triggered her asthma.   Review of Systems    general pulmonary and neurologic review of systems otherwise negative Objective:   Physical Exam  Well-developed well-nourished female in acute distress. Pulmonary exam negative except for some very mild late expiratory wheezing      Assessment & Plan:  Asthma increase Pulmicort to 2 puffs twice a day albuterol and cough syrup when necessary return when necessary

## 2011-05-20 NOTE — Patient Instructions (Signed)
Increase the Pulmicort to 2 puffs twice daily  Albuterol 2 puffs 4 times daily when necessary   lots of water  Vaporizer in y  bedroom at night  Hydromet 1/2-1 teaspoon each bedtime when necessary for cough

## 2011-06-17 ENCOUNTER — Other Ambulatory Visit: Payer: Self-pay | Admitting: Family Medicine

## 2011-07-15 ENCOUNTER — Other Ambulatory Visit: Payer: Self-pay | Admitting: Obstetrics and Gynecology

## 2011-07-15 DIAGNOSIS — N63 Unspecified lump in unspecified breast: Secondary | ICD-10-CM

## 2011-07-17 ENCOUNTER — Ambulatory Visit
Admission: RE | Admit: 2011-07-17 | Discharge: 2011-07-17 | Disposition: A | Discharge status: Home / Self Care | Payer: BC Managed Care – PPO | Source: Ambulatory Visit | Attending: Obstetrics and Gynecology | Admitting: Obstetrics and Gynecology

## 2011-07-17 DIAGNOSIS — N63 Unspecified lump in unspecified breast: Secondary | ICD-10-CM

## 2011-07-22 ENCOUNTER — Ambulatory Visit: Payer: BC Managed Care – PPO | Admitting: Neurology

## 2011-07-28 ENCOUNTER — Other Ambulatory Visit: Payer: Self-pay | Admitting: Obstetrics and Gynecology

## 2011-07-28 DIAGNOSIS — Z1231 Encounter for screening mammogram for malignant neoplasm of breast: Secondary | ICD-10-CM

## 2011-08-05 ENCOUNTER — Ambulatory Visit (INDEPENDENT_AMBULATORY_CARE_PROVIDER_SITE_OTHER): Payer: BC Managed Care – PPO | Admitting: Neurology

## 2011-08-05 ENCOUNTER — Other Ambulatory Visit: Payer: Self-pay | Admitting: Neurology

## 2011-08-05 ENCOUNTER — Encounter: Payer: Self-pay | Admitting: Neurology

## 2011-08-05 VITALS — BP 128/76 | HR 84 | Wt 146.0 lb

## 2011-08-05 DIAGNOSIS — R42 Dizziness and giddiness: Secondary | ICD-10-CM

## 2011-08-05 NOTE — Progress Notes (Signed)
Dear Dr. Tawanna Cooler,  I saw  Debbora Ang back in [facility] for her problem with migraine headaches with visual blurring and vertigo.  As you may recall, she is a 46 y.o. year old female with a history of what I felt to be migraine headaches who had persistent headache for a month before I saw her.  She had also had a recurrence of paroxysmal severe headaches that I felt were migraines with visual aura of blurriness of peripheral vision.  Maxalt, Zomig were ineffective and Imitrex worked but she had worsening nausea.  I felt that the recurrence of her headaches was likely due to her recently stopping her OCP.  On my advice she has restarted her low dose OCP.  Her migraine headaches have remitted significantly.  She has only had a 4 day stretch of migraines and she attributes this to running out of her OCP.    She does say that she still gets occipital headaches of "muscle tension".  These are occuring almost daily.  She tried Zanaflex for these, but it "knocked her out" at a dose of 4mg .  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  It "is always there", brought on by sudden head movements and can be so disabling that she has to stop driving.  She has had it for more than 15 years and thinks that it has gotten better over that amount of time.  It lasts seconds.  She has had vestibular therapy for it before several years ago.  No change in hearing, she does get tinnitus at times.  Her anxiety attacks have gotten better.  She also gets what she calls "night terrors", where she will wake up with her heart pounding.    Medical history, social history, and family history were reviewed and have not changed since the last clinic visit.  Current Outpatient Prescriptions on File Prior to Visit  Medication Sig Dispense Refill  . albuterol (PROVENTIL,VENTOLIN) 90 MCG/ACT inhaler Inhale 2 puffs into the lungs every 6 (six) hours as needed.  17 g  2  . budesonide (PULMICORT) 180 MCG/ACT  inhaler Inhale 1 puff into the lungs 2 (two) times daily.  2 Inhaler  3  . diazepam (VALIUM) 2 MG tablet Take 1 tablet (2 mg total) by mouth every 6 (six) hours as needed for anxiety. Twice daily as needed  30 tablet  5  . HYDROcodone-homatropine (HYCODAN) 5-1.5 MG/5ML syrup One half to 1 teaspoon each bedtime when necessary for cough and cold  120 mL  2  . hyoscyamine (LEVSIN SL) 0.125 MG SL tablet Take 0.125 mg by mouth every 4 (four) hours as needed.      . montelukast (SINGULAIR) 10 MG tablet Take 1 tablet (10 mg total) by mouth at bedtime.  100 tablet  3  . omeprazole (PRILOSEC) 20 MG capsule Take 20 mg by mouth daily.      . SUMAtriptan (IMITREX) 100 MG tablet Take 1 tablet (100 mg total) by mouth once as needed for migraine (may repeat in 2 hours).  9 tablet  2  . tiZANidine (ZANAFLEX) 4 MG tablet One p.o. B.i.d.  30 tablet  1  . zolmitriptan (ZOMIG ZMT) 2.5 MG disintegrating tablet Take 1 tablet (2.5 mg total) by mouth as needed for migraine.  10 tablet  11  . zolpidem (AMBIEN) 10 MG tablet Take 1 tablet (10 mg total) by mouth at bedtime as needed.  30 tablet  5  . aspirin 81 MG tablet Take  81 mg by mouth daily.        Marland Kitchen HYDROcodone-acetaminophen (VICODIN ES) 7.5-750 MG per tablet TAKE 1/2-1 TABLET BY MOUTH EVERY 4 HOURS AS NEEDED FOR MIGRAINE  90 tablet  0  . predniSONE (DELTASONE) 20 MG tablet 3 tabs now then starting tomorrow two tabs x 3 days, one tab x 3 days, a half a tab x 3 days, then a half a tablEvery other day for two weeks  40 tablet  1  . topiramate (TOPAMAX) 25 MG tablet Take 1 tablet (25 mg total) by mouth 2 (two) times daily.  60 tablet  11    Allergies  Allergen Reactions  . Citalopram Hydrobromide Nausea And Vomiting    ROS:  13 systems were reviewed and are notable for increased stress at work.  All other review of systems are unremarkable.  Exam: . Filed Vitals:   08/05/11 0955  BP: 128/76  Pulse: 84  Weight: 146 lb (66.225 kg)    In general, well appearing  women.  Mental status:   The patient is oriented to person, place and time. Recent and remote memory are intact. Attention span and concentration are normal. Language including repetition, naming, following commands are intact. Fund of knowledge of current and historical events, as well as vocabulary are normal.  Vestibular : D-H induces dizziness bilaterally, but no nystagmus.  Head thrust -, [place] suggests right sided hypofunction.  Impression/Recommendations: 1.  Migraine with visual aura - I discussed the risk of migraines with aura, OCP and stroke.  However, given she is using a low dose OCP, she is not a smoker and the OCP seems to greatly control her headaches I think it is an acceptable risk.  She will try Imitrex + Compazine as an abortive. 2.  Tension headaches - she can use 2mg  of zanaflex to see if this can help these 3.  Poorly differentiated vertigo - I think it is likely peripheral, will send her to vestibular rehab as she has not been for years.  We will see the patient back in 4 months.  Lupita Raider Modesto Charon, MD [facility], [place]

## 2011-08-05 NOTE — Patient Instructions (Signed)
We will refer you to the [facility] located at [address] in [place] for [employer]. They will call you to make the appointment.   [phone].

## 2011-08-19 ENCOUNTER — Ambulatory Visit: Payer: BC Managed Care – PPO | Attending: Neurology | Admitting: Physical Therapy

## 2011-08-19 DIAGNOSIS — IMO0001 Reserved for inherently not codable concepts without codable children: Secondary | ICD-10-CM | POA: Insufficient documentation

## 2011-08-19 DIAGNOSIS — R42 Dizziness and giddiness: Secondary | ICD-10-CM | POA: Insufficient documentation

## 2011-08-19 DIAGNOSIS — R269 Unspecified abnormalities of gait and mobility: Secondary | ICD-10-CM | POA: Insufficient documentation

## 2011-09-02 ENCOUNTER — Ambulatory Visit
Admission: RE | Admit: 2011-09-02 | Discharge: 2011-09-02 | Disposition: A | Discharge status: Home / Self Care | Payer: BC Managed Care – PPO | Source: Ambulatory Visit | Attending: Obstetrics and Gynecology | Admitting: Obstetrics and Gynecology

## 2011-09-02 DIAGNOSIS — Z1231 Encounter for screening mammogram for malignant neoplasm of breast: Secondary | ICD-10-CM

## 2011-09-12 ENCOUNTER — Other Ambulatory Visit: Payer: Self-pay | Admitting: *Deleted

## 2011-09-12 DIAGNOSIS — R51 Headache: Secondary | ICD-10-CM

## 2011-09-12 MED ORDER — TIZANIDINE HCL 4 MG PO TABS: ORAL_TABLET | ORAL | Status: DC

## 2011-10-01 ENCOUNTER — Other Ambulatory Visit: Payer: Self-pay | Admitting: *Deleted

## 2011-10-01 DIAGNOSIS — F411 Generalized anxiety disorder: Secondary | ICD-10-CM

## 2011-10-01 MED ORDER — DIAZEPAM 2 MG PO TABS: 2.0000 mg | ORAL_TABLET | Freq: Four times a day (QID) | ORAL | Status: DC | PRN

## 2011-10-01 MED ORDER — ZOLPIDEM TARTRATE 10 MG PO TABS: 10.0000 mg | ORAL_TABLET | Freq: Every evening | ORAL | Status: DC | PRN

## 2011-10-20 ENCOUNTER — Other Ambulatory Visit: Payer: Self-pay | Admitting: Family Medicine

## 2011-11-06 ENCOUNTER — Ambulatory Visit (INDEPENDENT_AMBULATORY_CARE_PROVIDER_SITE_OTHER): Payer: BC Managed Care – PPO | Admitting: Family Medicine

## 2011-11-06 ENCOUNTER — Encounter: Payer: Self-pay | Admitting: Family Medicine

## 2011-11-06 VITALS — BP 110/68 | Temp 98.8°F | Wt 150.0 lb

## 2011-11-06 DIAGNOSIS — R51 Headache: Secondary | ICD-10-CM

## 2011-11-06 DIAGNOSIS — R5383 Other fatigue: Secondary | ICD-10-CM | POA: Insufficient documentation

## 2011-11-06 DIAGNOSIS — J45909 Unspecified asthma, uncomplicated: Secondary | ICD-10-CM

## 2011-11-06 DIAGNOSIS — R5381 Other malaise: Secondary | ICD-10-CM

## 2011-11-06 MED ORDER — AMITRIPTYLINE HCL 25 MG PO TABS: 25.0000 mg | ORAL_TABLET | Freq: Every day | ORAL | Status: DC

## 2011-11-06 NOTE — Progress Notes (Signed)
  Subjective:    Patient ID: Cheryl Brooks, female    DOB: 10/26/1965, 46 y.o.   MRN: [id number]  HPI Terrilee is a 46 year old female nonsmoker who comes in today with a week and a half history of fatigue  The only physical thing she's noticed is some changes in body temperature but no fever. She denies any hot flashes she says she's had these before and knows what they are. She is on continuous BCPs because of the migraine headaches  She has no fever earache sore throat nausea vomiting diarrhea change in urinary habits constipation weight loss etc. No skin changes. She has had a slight cough  Social history is significant and she states has a lot of stress at work and is beginning to affect her. She tried starting an exercise program at 6 PM and evening but she felt worse. She takes 10 mg of Ambien at bedtime because she doesn't she has anxiety attacks.   Review of Systems    general physical psychiatric review of systems otherwise negative Objective:   Physical Exam  Well-developed well-nourished female no acute distress HEENT negative neck was supple no adenopathy lungs are clear cardiac exam normal abdominal exam normal skin exam normal except for scars from previous lesions have been removed      Assessment & Plan:  Fatigue probably secondary to job stress plan decrease Ambien to 5 mg a day add 25 mg of Elavil at bedtime return in 3 weeks for followup

## 2011-11-06 NOTE — Patient Instructions (Signed)
Decrease the Ambien to 5 mg  At 25 mg of Elavil at bedtime  Return in 3 weeks for followup

## 2011-11-14 ENCOUNTER — Telehealth: Payer: Self-pay | Admitting: Family Medicine

## 2011-11-14 NOTE — Telephone Encounter (Signed)
Pt called and said that she dropped by some lab work last Thursday for nurse to review. Nurse was suppose to get back to pt and let her know if pt still needed to do the labs that are schd for next week.

## 2011-11-14 NOTE — Telephone Encounter (Signed)
Please inform the patient she does not need additional labs

## 2011-11-17 ENCOUNTER — Other Ambulatory Visit: Payer: Self-pay | Admitting: *Deleted

## 2011-11-17 MED ORDER — BUDESONIDE 180 MCG/ACT IN AEPB: 1.0000 | INHALATION_SPRAY | Freq: Two times a day (BID) | RESPIRATORY_TRACT | Status: DC

## 2011-11-17 NOTE — Telephone Encounter (Signed)
Called pt and notified her that no additional labs are needed. Pt is still coming in on 11/26/11 for cpx, but cpx labs were cx.

## 2011-11-19 ENCOUNTER — Other Ambulatory Visit: Payer: BC Managed Care – PPO

## 2011-11-26 ENCOUNTER — Ambulatory Visit (INDEPENDENT_AMBULATORY_CARE_PROVIDER_SITE_OTHER): Payer: BC Managed Care – PPO | Admitting: Family Medicine

## 2011-11-26 ENCOUNTER — Encounter: Payer: Self-pay | Admitting: Family Medicine

## 2011-11-26 VITALS — BP 102/70 | Temp 98.5°F | Ht 68.25 in | Wt 152.0 lb

## 2011-11-26 DIAGNOSIS — K219 Gastro-esophageal reflux disease without esophagitis: Secondary | ICD-10-CM

## 2011-11-26 DIAGNOSIS — F411 Generalized anxiety disorder: Secondary | ICD-10-CM

## 2011-11-26 DIAGNOSIS — R5381 Other malaise: Secondary | ICD-10-CM

## 2011-11-26 DIAGNOSIS — R5383 Other fatigue: Secondary | ICD-10-CM

## 2011-11-26 DIAGNOSIS — R51 Headache: Secondary | ICD-10-CM

## 2011-11-26 DIAGNOSIS — J45909 Unspecified asthma, uncomplicated: Secondary | ICD-10-CM

## 2011-11-26 MED ORDER — HYOSCYAMINE SULFATE 0.125 MG SL SUBL: 0.1250 mg | SUBLINGUAL_TABLET | SUBLINGUAL | Status: DC | PRN

## 2011-11-26 MED ORDER — MONTELUKAST SODIUM 10 MG PO TABS: 10.0000 mg | ORAL_TABLET | Freq: Every day | ORAL | Status: DC

## 2011-11-26 MED ORDER — HYDROCODONE-ACETAMINOPHEN 7.5-750 MG PO TABS: 1.0000 | ORAL_TABLET | ORAL | Status: DC | PRN

## 2011-11-26 MED ORDER — TIZANIDINE HCL 4 MG PO TABS: ORAL_TABLET | ORAL | Status: DC

## 2011-11-26 MED ORDER — DIAZEPAM 2 MG PO TABS: 2.0000 mg | ORAL_TABLET | Freq: Four times a day (QID) | ORAL | Status: DC | PRN

## 2011-11-26 MED ORDER — OMEPRAZOLE 20 MG PO CPDR: 20.0000 mg | DELAYED_RELEASE_CAPSULE | Freq: Every day | ORAL | Status: AC

## 2011-11-26 MED ORDER — SUMATRIPTAN SUCCINATE 100 MG PO TABS: 100.0000 mg | ORAL_TABLET | Freq: Once | ORAL | Status: DC | PRN

## 2011-11-26 MED ORDER — ZOLPIDEM TARTRATE 5 MG PO TABS: ORAL_TABLET | ORAL | Status: DC

## 2011-11-26 MED ORDER — BUDESONIDE 180 MCG/ACT IN AEPB: 1.0000 | INHALATION_SPRAY | Freq: Two times a day (BID) | RESPIRATORY_TRACT | Status: DC

## 2011-11-26 NOTE — Progress Notes (Signed)
  Subjective:    Patient ID: Cheryl Brooks, female    DOB: 1965/06/02, 46 y.o.   MRN: [id number]  HPI Cheryl Brooks is a 46 year old single female nonsmoker who comes in today for general physical examination  She has a history of asthma and is on Pulmicort 1 puff twice a day and albuterol when necessary and 10 mg of Singulair daily. Her cough is minimal not completely gone  Over the last couple months she's had symptoms of fatigue etiology unknown the symptoms are slowly resolving spontaneously.  She takes Levsin when necessary for IBS  She is on BCPs via GYN continuous this is held decrease her migraine headaches  She takes omeprazole 20 mg daily for reflux  She also takes Imitrex when necessary for migraines with along with Zanaflex.  She takes 5 mg of Ambien each bedtime for sleep     Review of Systems  Constitutional: Negative.   HENT: Negative.   Eyes: Negative.   Respiratory: Negative.   Cardiovascular: Negative.   Gastrointestinal: Negative.   Genitourinary: Negative.   Musculoskeletal: Negative.   Neurological: Negative.   Hematological: Negative.   Psychiatric/Behavioral: Negative.        Objective:   Physical Exam  Constitutional: She appears well-developed and well-nourished.  HENT:  Head: Normocephalic and atraumatic.  Right Ear: External ear normal.  Left Ear: External ear normal.  Nose: Nose normal.  Mouth/Throat: Oropharynx is clear and moist.  Eyes: EOM are normal. Pupils are equal, round, and reactive to light.  Neck: Normal range of motion. Neck supple. No thyromegaly present.  Cardiovascular: Normal rate, regular rhythm, normal heart sounds and intact distal pulses.  Exam reveals no gallop and no friction rub.   No murmur heard. Pulmonary/Chest: Effort normal and breath sounds normal.  Abdominal: Soft. Bowel sounds are normal. She exhibits no distension and no mass. There is no tenderness. There is no rebound.  Genitourinary:       Bilateral breast exam  shows multiple fibrocystic changes  Musculoskeletal: Normal range of motion.  Lymphadenopathy:    She has no cervical adenopathy.  Neurological: She is alert. She has normal reflexes. No cranial nerve deficit. She exhibits normal muscle tone. Coordination normal.  Skin: Skin is warm and dry.  Psychiatric: She has a normal mood and affect. Her behavior is normal. Judgment and thought content normal.          Assessment & Plan:  Healthy female  Asthma continue current medication  IBS continue current medication  DU be continued continuous BCPs  Reflux esophagitis continue Prilosec  Migraine headaches continue Imitrex and Zanaflex when necessary  Sleep dysfunction continue Ambien 5 mg one half tab each bedtime when necessary  Anxiety continue Valium when necessary

## 2011-11-26 NOTE — Patient Instructions (Signed)
Continue your current medications  Return in one year sooner if any problems  Decrease the Ambien 5 mg one half tab each bedtime when necessary for sleep

## 2012-02-24 ENCOUNTER — Ambulatory Visit (INDEPENDENT_AMBULATORY_CARE_PROVIDER_SITE_OTHER): Payer: BC Managed Care – PPO | Admitting: Family Medicine

## 2012-02-24 ENCOUNTER — Encounter: Payer: Self-pay | Admitting: Family Medicine

## 2012-02-24 VITALS — BP 130/80 | HR 102 | Temp 98.2°F | Wt 151.0 lb

## 2012-02-24 DIAGNOSIS — J069 Acute upper respiratory infection, unspecified: Secondary | ICD-10-CM

## 2012-02-24 DIAGNOSIS — R51 Headache: Secondary | ICD-10-CM

## 2012-02-24 DIAGNOSIS — J45909 Unspecified asthma, uncomplicated: Secondary | ICD-10-CM

## 2012-02-24 MED ORDER — HYDROCODONE-HOMATROPINE 5-1.5 MG/5ML PO SYRP: ORAL_SOLUTION | ORAL | Status: DC

## 2012-02-24 MED ORDER — PREDNISONE 20 MG PO TABS: ORAL_TABLET | ORAL | Status: DC

## 2012-02-24 NOTE — Patient Instructions (Signed)
Drink lots of water  Hydromet,,,,,,,,,,, 1/2-1 teaspoon 3 times daily when necessary for cough  Increase the Pulmicort,,,,,,,, 2 puffs twice daily  Proventil,,,,,,,,,,,, 2 puffs 3 times daily  Refill the oral prednisone when necessary

## 2012-02-24 NOTE — Progress Notes (Signed)
  Subjective:    Patient ID: Cheryl Brooks, female    DOB: 02/05/1966, 46 y.o.   MRN: [id number]  HPI Chaz is a 46 year old female nonsmoker who comes in with a five-day history of head congestion sore throat and cough  She has a history of chronic asthma and is on Pulmicort 1 puff twice a day and albuterol when necessary  No fever some sputum production minimal shortness of breath   Review of Systems Pulmonary review of systems otherwise negative    Objective:   Physical Exam Well-developed well-nourished female in no acute distress HEENT negative neck was supple no adenopathy lungs are clear except for some mild late expiratory wheezing       Assessment & Plan:  Viral syndrome with secondary asthma plan increase water intake double Pulmicort Proventil when necessary Hydromet when necessary for cough return when necessary

## 2012-03-03 ENCOUNTER — Other Ambulatory Visit: Payer: Self-pay | Admitting: Family Medicine

## 2012-03-05 ENCOUNTER — Other Ambulatory Visit: Payer: Self-pay | Admitting: *Deleted

## 2012-03-05 MED ORDER — HYDROCODONE-ACETAMINOPHEN 7.5-325 MG PO TABS: 1.0000 | ORAL_TABLET | ORAL | Status: DC | PRN

## 2012-04-28 ENCOUNTER — Other Ambulatory Visit: Payer: Self-pay | Admitting: *Deleted

## 2012-04-28 DIAGNOSIS — F411 Generalized anxiety disorder: Secondary | ICD-10-CM

## 2012-04-28 MED ORDER — DIAZEPAM 2 MG PO TABS: 2.0000 mg | ORAL_TABLET | Freq: Four times a day (QID) | ORAL | Status: DC | PRN

## 2012-06-11 ENCOUNTER — Other Ambulatory Visit: Payer: Self-pay | Admitting: *Deleted

## 2012-06-11 DIAGNOSIS — J45909 Unspecified asthma, uncomplicated: Secondary | ICD-10-CM

## 2012-06-11 MED ORDER — BUDESONIDE 180 MCG/ACT IN AEPB: 1.0000 | INHALATION_SPRAY | Freq: Two times a day (BID) | RESPIRATORY_TRACT | Status: DC

## 2012-07-12 ENCOUNTER — Other Ambulatory Visit: Payer: Self-pay | Admitting: Family Medicine

## 2012-07-21 ENCOUNTER — Other Ambulatory Visit: Payer: Self-pay | Admitting: Obstetrics and Gynecology

## 2012-07-26 ENCOUNTER — Other Ambulatory Visit: Payer: Self-pay

## 2012-07-26 DIAGNOSIS — Z1231 Encounter for screening mammogram for malignant neoplasm of breast: Secondary | ICD-10-CM

## 2012-08-06 ENCOUNTER — Telehealth: Payer: Self-pay | Admitting: Family Medicine

## 2012-08-06 NOTE — Telephone Encounter (Signed)
Pt would like to switch from Dr Tawanna Cooler to Dr Caryl Never. Pt is aware Dr Tawanna Cooler is out of office to day

## 2012-08-06 NOTE — Telephone Encounter (Signed)
OK with me.

## 2012-08-09 NOTE — Telephone Encounter (Signed)
Ok w me and dr. Bb........ Telephone her to make an appointment to see Dr. Sharman Cheek to get established

## 2012-08-10 NOTE — Telephone Encounter (Signed)
Pt is sch for cpx on 12-06-12

## 2012-08-25 ENCOUNTER — Other Ambulatory Visit: Payer: Self-pay | Admitting: *Deleted

## 2012-08-25 DIAGNOSIS — R51 Headache: Secondary | ICD-10-CM

## 2012-08-25 MED ORDER — TIZANIDINE HCL 4 MG PO TABS: ORAL_TABLET | ORAL | Status: DC

## 2012-09-02 ENCOUNTER — Ambulatory Visit
Admission: RE | Admit: 2012-09-02 | Discharge: 2012-09-02 | Disposition: A | Discharge status: Home / Self Care | Payer: BC Managed Care – PPO | Source: Ambulatory Visit

## 2012-09-02 DIAGNOSIS — Z1231 Encounter for screening mammogram for malignant neoplasm of breast: Secondary | ICD-10-CM

## 2012-09-28 ENCOUNTER — Ambulatory Visit (INDEPENDENT_AMBULATORY_CARE_PROVIDER_SITE_OTHER): Payer: BC Managed Care – PPO | Admitting: Family Medicine

## 2012-09-28 ENCOUNTER — Encounter: Payer: Self-pay | Admitting: Family Medicine

## 2012-09-28 VITALS — BP 110/80 | HR 85 | Temp 98.4°F | Ht 68.0 in | Wt 162.0 lb

## 2012-09-28 DIAGNOSIS — J45909 Unspecified asthma, uncomplicated: Secondary | ICD-10-CM

## 2012-09-28 DIAGNOSIS — J209 Acute bronchitis, unspecified: Secondary | ICD-10-CM

## 2012-09-28 MED ORDER — HYDROCODONE-HOMATROPINE 5-1.5 MG/5ML PO SYRP: ORAL_SOLUTION | ORAL | Status: DC

## 2012-09-28 NOTE — Patient Instructions (Addendum)
Follow up promptly for any fever or increase shortness or breath.

## 2012-09-28 NOTE — Progress Notes (Signed)
  Subjective:    Patient ID: Cheryl Brooks, female    DOB: 12-Apr-1965, 47 y.o.   MRN: [id number]  HPI Patient seen with 2 to three-day history of cough which is mostly nonproductive  Fatigue which has been more chronic. Denies any recent fever or chills. Minimal nasal congestion. No sore throat. No nausea or vomiting. She apparently has mild persistent asthma and takes Pulmicort once daily and albuterol as needed. Nonsmoker. No recent dyspnea. No pleuritic pain. No hemoptysis.  Past Medical History  Diagnosis Date  . Asthma   . Anxiety   . Depression   . GERD (gastroesophageal reflux disease)   . [id number](478.2)    Past Surgical History  Procedure Laterality Date  . Tonsillectomy  2007  . Eye surgery  1972  . Laparoscopic tubal ligation  10/03/2010    Procedure: LAPAROSCOPIC TUBAL LIGATION;  Surgeon: Miguel Aschoff;  Location: [facility];  Service: Gynecology;  Laterality: Bilateral;    reports that she has quit smoking. She has never used smokeless tobacco. She reports that she drinks about 1.2 ounces of alcohol per week. She reports that she does not use illicit drugs. family history is not on file. Allergies  Allergen Reactions  . Citalopram Hydrobromide Nausea And Vomiting      Review of Systems  Constitutional: Positive for fatigue.  HENT: Negative for sore throat.   Respiratory: Positive for cough. Negative for shortness of breath and wheezing.   Neurological: Negative for headaches.       Objective:   Physical Exam  Constitutional: She appears well-developed and well-nourished.  HENT:  Right Ear: External ear normal.  Left Ear: External ear normal.  Mouth/Throat: Oropharynx is clear and moist.  Cardiovascular: Normal rate and regular rhythm.   Pulmonary/Chest: Effort normal and breath sounds normal. No respiratory distress. She has no wheezes. She has no rales.          Assessment & Plan:  Acute cough. Suspect viral bronchitis. Refill Hycodan cough syrup for as  needed use. Followup promptly for any fever.

## 2012-10-21 ENCOUNTER — Other Ambulatory Visit: Payer: Self-pay | Admitting: Family Medicine

## 2012-10-24 NOTE — Telephone Encounter (Signed)
Refill for one month until Dr Tawanna Cooler returns.

## 2012-10-26 NOTE — Telephone Encounter (Signed)
Refill for 3 months. 

## 2012-10-26 NOTE — Telephone Encounter (Signed)
This patient has transferred to Dr Caryl Never per phone note. Okay to refill?

## 2012-11-29 ENCOUNTER — Other Ambulatory Visit (INDEPENDENT_AMBULATORY_CARE_PROVIDER_SITE_OTHER): Payer: BC Managed Care – PPO

## 2012-11-29 DIAGNOSIS — Z0000 Encounter for general adult medical examination without abnormal findings: Secondary | ICD-10-CM

## 2012-11-29 LAB — CBC WITH DIFFERENTIAL/PLATELET
Basophils Relative: 0.5 % (ref 0.0–3.0)
Eosinophils Relative: 1.5 % (ref 0.0–5.0)
HCT: 41.6 % (ref 36.0–46.0)
Hemoglobin: 13.9 g/dL (ref 12.0–15.0)
Lymphs Abs: 3.4 10*3/uL (ref 0.7–4.0)
Monocytes Relative: 5.8 % (ref 3.0–12.0)
Neutro Abs: 4.5 10*3/uL (ref 1.4–7.7)
WBC: 8.6 10*3/uL (ref 4.5–10.5)

## 2012-11-29 LAB — POCT URINALYSIS DIPSTICK
Bilirubin, UA: NEGATIVE
Blood, UA: NEGATIVE
Nitrite, UA: NEGATIVE
Urobilinogen, UA: 0.2
pH, UA: 7

## 2012-11-29 LAB — LIPID PANEL: HDL: 40.6 mg/dL (ref >39.00)

## 2012-11-29 LAB — HEPATIC FUNCTION PANEL
ALT: 18 U/L (ref 0–35)
AST: 19 U/L (ref 0–37)
Albumin: 3.8 g/dL (ref 3.5–5.2)

## 2012-11-29 LAB — BASIC METABOLIC PANEL
GFR: 71.97 mL/min (ref >60.00)
Potassium: 4.9 mEq/L (ref 3.5–5.1)
Sodium: 139 mEq/L (ref 135–145)

## 2012-11-29 LAB — TSH: TSH: 2.14 u[IU]/mL (ref 0.35–5.50)

## 2012-11-29 LAB — LDL CHOLESTEROL, DIRECT: Direct LDL: 165.6 mg/dL

## 2012-12-03 ENCOUNTER — Other Ambulatory Visit: Payer: Self-pay | Admitting: Family Medicine

## 2012-12-03 NOTE — Telephone Encounter (Signed)
Refill once 

## 2012-12-06 ENCOUNTER — Ambulatory Visit (INDEPENDENT_AMBULATORY_CARE_PROVIDER_SITE_OTHER): Payer: BC Managed Care – PPO | Admitting: Family Medicine

## 2012-12-06 ENCOUNTER — Encounter: Payer: Self-pay | Admitting: Family Medicine

## 2012-12-06 VITALS — BP 116/64 | HR 92 | Temp 98.3°F | Ht 68.0 in | Wt 165.0 lb

## 2012-12-06 DIAGNOSIS — Z0000 Encounter for general adult medical examination without abnormal findings: Secondary | ICD-10-CM

## 2012-12-06 DIAGNOSIS — Z23 Encounter for immunization: Secondary | ICD-10-CM

## 2012-12-06 NOTE — Patient Instructions (Addendum)
Moles  Moles are usually harmless growths on the skin. They are accumulations of color (pigment) cells in the skin that:    Can be various colors, from light brown to black.   Can appear anywhere on the body.   May remain flat or become raised.   May contain hairs.   May remain smooth or develop wrinkling.  Most moles are not cancerous (benign). However, some moles may develop changes and become cancerous. It is important to check your moles every month. If you check your moles regularly, you will be able to notice any changes that may occur.   CAUSES   Moles occur when skin cells grow together in clusters instead of spreading out in the skin as they normally do. The reason for this clustering is unknown.  DIAGNOSIS   Your caregiver will perform a skin examination to diagnose your mole.   TREATMENT   Moles usually do not require treatment. If a mole becomes worrisome, your caregiver may choose to take a sample of the mole or remove it entirely, and then send it to a lab for examination.   HOME CARE INSTRUCTIONS   Check your mole(s) monthly for changes that may indicate skin cancer. These changes can include:   A change in size.   A change in color. Note that moles tend to darken during pregnancy or when taking birth control pills (oral contraception).   A change in shape.   A change in the border of the mole.   Wear sunscreen (with an SPF of at least 30) when you spend long periods of time outside. Reapply the sunscreen every 2 3 hours.   Schedule annual appointments with your skin doctor (dermatologist) if you have a large number of moles.  SEEK MEDICAL CARE IF:   Your mole changes size, especially if it becomes larger than a pencil eraser.   Your mole changes in color or develops more than one color.   Your mole becomes itchy or bleeds.   Your mole, or the skin near the mole, becomes painful, sore, red, or swollen.   Your mole becomes scaly, sheds skin, or oozes fluid.    Your mole develops irregular borders.   Your mole becomes flat or develops raised areas.   Your mole becomes hard or soft.  Document Released: 12/03/2000 Document Revised: 12/03/2011 Document Reviewed: 09/22/2011  Exit[employer] Patient Information 2014 [employer].

## 2012-12-06 NOTE — Progress Notes (Signed)
Subjective:    Patient ID: Cheryl Brooks, female    DOB: October 25, 1965, 47 y.o.   MRN: [id number]  HPI Patient seen for complete physical She still sees gynecologist regularly. She's had tremendous job stress recently. She is looking at possible career change. 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 She has migraine headaches which she relates to job stress mostly. These have been improved with continuous low-dose estrogen per her gynecologist. She has history of asthma which is stable on Pulmicort and Singulair. She needs flu vaccine.  Past Medical History  Diagnosis Date  . Asthma   . Anxiety   . Depression   . GERD (gastroesophageal reflux disease)   . WUJWJXBJ(478.2)    Past Surgical History  Procedure Laterality Date  . Tonsillectomy  2007  . Eye surgery  1972  . Laparoscopic tubal ligation  10/03/2010    Procedure: LAPAROSCOPIC TUBAL LIGATION;  Surgeon: Miguel Aschoff;  Location: [facility];  Service: Gynecology;  Laterality: Bilateral;    reports that she has quit smoking. She has never used smokeless tobacco. She reports that she drinks about 1.2 ounces of alcohol per week. She reports that she does not use illicit drugs. family history is not on file. Allergies  Allergen Reactions  . Citalopram Hydrobromide Nausea And Vomiting      Review of Systems  Constitutional: Positive for fatigue. Negative for fever, activity change, appetite change and unexpected weight change.  HENT: Negative for hearing loss, ear pain, sore throat and trouble swallowing.   Eyes: Negative for visual disturbance.  Respiratory: Negative for cough and shortness of breath.   Cardiovascular: Negative for chest pain and palpitations.  Gastrointestinal: Negative for abdominal pain, diarrhea, constipation and blood in stool.  Genitourinary:  Negative for dysuria and hematuria.  Musculoskeletal: Negative for myalgias, back pain and arthralgias.  Skin: Negative for rash.  Neurological: Negative for dizziness, syncope and headaches.  Hematological: Negative for adenopathy.  Psychiatric/Behavioral: Negative for confusion and dysphoric mood. The patient is nervous/anxious.        Objective:   Physical Exam  Constitutional: She is oriented to person, place, and time. She appears well-developed and well-nourished.  HENT:  Head: Normocephalic and atraumatic.  Eyes: EOM are normal. Pupils are equal, round, and reactive to light.  Neck: Normal range of motion. Neck supple. No thyromegaly present.  Cardiovascular: Normal rate, regular rhythm and normal heart sounds.   No murmur heard. Pulmonary/Chest: Breath sounds normal. No respiratory distress. She has no wheezes. She has no rales.  Abdominal: Soft. Bowel sounds are normal. She exhibits no distension and no mass. There is no tenderness. There is no rebound and no guarding.  Genitourinary:  Deferred per GYN  Musculoskeletal: Normal range of motion. She exhibits no edema.  Lymphadenopathy:    She has no cervical adenopathy.  Neurological: She is alert and oriented to person, place, and time. She displays normal reflexes. No cranial nerve deficit.  Skin: No rash noted.  Patient has multiple nevi but none with any major atypical changes  Psychiatric: She has a normal mood and affect. Her behavior is normal. Judgment and thought content normal.          Assessment & Plan:  Complete physical. Tetanus given. Flu vaccine given. We discussed regular exercise. Labs reviewed. She has dyslipidemia. Her calculated 10 year risk for CAD is  only 1%. She has elected not to take any statins at this time.

## 2012-12-13 ENCOUNTER — Other Ambulatory Visit: Payer: Self-pay | Admitting: Family Medicine

## 2013-01-11 ENCOUNTER — Ambulatory Visit (INDEPENDENT_AMBULATORY_CARE_PROVIDER_SITE_OTHER): Payer: BC Managed Care – PPO | Admitting: Family Medicine

## 2013-01-11 ENCOUNTER — Encounter: Payer: Self-pay | Admitting: Family Medicine

## 2013-01-11 VITALS — BP 118/80 | HR 101 | Temp 98.2°F | Wt 163.0 lb

## 2013-01-11 DIAGNOSIS — M25561 Pain in right knee: Secondary | ICD-10-CM

## 2013-01-11 DIAGNOSIS — M25569 Pain in unspecified knee: Secondary | ICD-10-CM

## 2013-01-11 NOTE — Progress Notes (Signed)
  Subjective:    Patient ID: Cheryl Brooks, female    DOB: May 01, 1965, 47 y.o.   MRN: [id number]  HPI Acute visit for right knee pain. She relates that she was walking and twisted this couple months ago but then seemed to improve. Past week has noticed some poorly localized mild soreness anterior knee. No edema. No bruising. No warmth or erythema. She has not had any locking or giving way. She noticed some soreness when going upstairs. No soreness to touch. She has not tried any anti-inflammatories nor has she tried any icing. No prior history of significant knee problems. Her pain is very minimal and for the most part not limiting ambulation at this time in any way.  Past Medical History  Diagnosis Date  . Asthma   . Anxiety   . Depression   . GERD (gastroesophageal reflux disease)   . DGLOVFIE(332.9)    Past Surgical History  Procedure Laterality Date  . Tonsillectomy  2007  . Eye surgery  1972  . Laparoscopic tubal ligation  10/03/2010    Procedure: LAPAROSCOPIC TUBAL LIGATION;  Surgeon: Miguel Aschoff;  Location: [facility];  Service: Gynecology;  Laterality: Bilateral;    reports that she has quit smoking. She has never used smokeless tobacco. She reports that she drinks about 1.2 ounces of alcohol per week. She reports that she does not use illicit drugs. family history is not on file. Allergies  Allergen Reactions  . Citalopram Hydrobromide Nausea And Vomiting      Review of Systems  Constitutional: Negative for fever and chills.  Neurological: Negative for weakness.       Objective:   Physical Exam  Constitutional: She appears well-developed and well-nourished.  Cardiovascular: Normal rate and regular rhythm.   Musculoskeletal:  Right knee reveals no effusion. No warmth. No erythema. Full range of motion. No localized tenderness. Ligament testing is normal.          Assessment & Plan:  Nonspecific right knee pain. Nonfocal exam. She is not endorsing patellofemoral  syndrome by history. She does not have any tenderness to suggest patellar tendinitis. We've recommended exercises to focus on quadriceps strengthening. Try over-the-counter anti-inflammatory. Touch base in 2-3 weeks if not improving

## 2013-01-11 NOTE — Patient Instructions (Signed)
Try some exercises that focus on quadriceps strengthening such as cycling, leg lifts/knee extensions, or eliptical. Follow up for any knee swelling, locking, or giving way.

## 2013-02-01 ENCOUNTER — Telehealth: Payer: Self-pay | Admitting: Family Medicine

## 2013-02-01 NOTE — Telephone Encounter (Signed)
Pt needs new rx hydrocodone °

## 2013-02-01 NOTE — Telephone Encounter (Signed)
Last refill 07/12/12 #90 1 refill Last visit 01/11/13

## 2013-02-01 NOTE — Telephone Encounter (Signed)
Confirm if she is taking intermittently for headache.  OK to refill #30 but should be using sparingly.  If using frequently for headache, we need to look at possible prevention medications.

## 2013-02-02 MED ORDER — HYDROCODONE-ACETAMINOPHEN 7.5-325 MG PO TABS: ORAL_TABLET | ORAL | Status: DC

## 2013-02-02 NOTE — Telephone Encounter (Signed)
Pt aware that RX will be ready at the front for pick up

## 2013-02-02 NOTE — Telephone Encounter (Signed)
Left message for patient to return call.

## 2013-02-03 ENCOUNTER — Other Ambulatory Visit: Payer: Self-pay | Admitting: Family Medicine

## 2013-03-25 ENCOUNTER — Telehealth: Payer: Self-pay

## 2013-03-25 NOTE — Telephone Encounter (Signed)
Diazepam 2 mg  Last visit 01-11-13 Last refill 10-21-12 #30 3 refill  [facility] [place]

## 2013-03-27 NOTE — Telephone Encounter (Signed)
Refill for 3 months. 

## 2013-03-28 ENCOUNTER — Encounter: Payer: Self-pay | Admitting: Family Medicine

## 2013-03-28 ENCOUNTER — Ambulatory Visit (INDEPENDENT_AMBULATORY_CARE_PROVIDER_SITE_OTHER): Payer: BC Managed Care – PPO | Admitting: Family Medicine

## 2013-03-28 VITALS — BP 104/74 | HR 105 | Temp 99.4°F | Wt 160.0 lb

## 2013-03-28 DIAGNOSIS — J069 Acute upper respiratory infection, unspecified: Secondary | ICD-10-CM

## 2013-03-28 MED ORDER — HYDROCODONE-HOMATROPINE 5-1.5 MG/5ML PO SYRP: 5.0000 mL | ORAL_SOLUTION | Freq: Three times a day (TID) | ORAL | Status: DC | PRN

## 2013-03-28 NOTE — Progress Notes (Signed)
Chief Complaint  Patient presents with  . Cough    runny nose, sore throat, congestion, body aches x Saturday     HPI:  -started: 2 days ago -symptoms:nasal congestion, sore throat, cough, body aches -denies:fever, SOB, NVD, tooth pain -has tried: nothing -sick contacts/travel/risks: denies flu exposure or Ebola risks -Hx of: husband with a cold too -reports want PCP gives her hycodan and wants this for cough and not allergic to it  ROS: See pertinent positives and negatives per HPI.  Past Medical History  Diagnosis Date  . Asthma   . Anxiety   . Depression   . GERD (gastroesophageal reflux disease)   . ZOXWRUEA(540.9Headache(784.0)     Past Surgical History  Procedure Laterality Date  . Tonsillectomy  2007  . Eye surgery  1972  . Laparoscopic tubal ligation  10/03/2010    Procedure: LAPAROSCOPIC TUBAL LIGATION;  Surgeon: Miguel AschoffAllan Ross;  Location: [facility];  Service: Gynecology;  Laterality: Bilateral;    No family history on file.  History   Social History  . Marital Status: Divorced    Spouse Name: N/A    Number of Children: N/A  . Years of Education: N/A   Social History Main Topics  . Smoking status: Former Games developermoker  . Smokeless tobacco: Never Used     Comment: only smoked 6 months while in college.  . Alcohol Use: 1.2 oz/week    1 Glasses of wine, 1 Shots of liquor per week     Comment: socially  . Drug Use: No  . Sexual Activity: None   Other Topics Concern  . None   Social History Narrative  . None    Current outpatient prescriptions:albuterol (PROVENTIL,VENTOLIN) 90 MCG/ACT inhaler, Inhale 2 puffs into the lungs every 6 (six) hours as needed., Disp: 17 g, Rfl: 2;  aspirin 81 MG tablet, Take 81 mg by mouth daily.  , Disp: , Rfl: ;  budesonide (PULMICORT) 180 MCG/ACT inhaler, Inhale 1 puff into the lungs 2 (two) times daily., Disp: 3 Inhaler, Rfl: 3 diazepam (VALIUM) 2 MG tablet, TAKE 1 TABLET BY MOUTH EVERY 6 HOURS AS NEEDED, Disp: 30 tablet, Rfl: 3;  doxepin  (SINEQUAN) 10 MG capsule, Take 10 mg by mouth 2 (two) times daily., Disp: , Rfl: ;  HYDROcodone-acetaminophen (NORCO) 7.5-325 MG per tablet, TAKE 1/2 TO 1 TABLET BY MOUTH EVERY 4 HOURS AS NEEDED FOR MIGRAINE, Disp: 30 tablet, Rfl: 0;  LO LOESTRIN FE 1 MG-10 MCG / 10 MCG tablet, , Disp: , Rfl:  montelukast (SINGULAIR) 10 MG tablet, TAKE 1 TABLET BY MOUTH AT BEDTIME, Disp: 100 tablet, Rfl: 3;  omeprazole (PRILOSEC) 20 MG capsule, Take 1 capsule (20 mg total) by mouth daily., Disp: 100 capsule, Rfl: 3;  SUMAtriptan (IMITREX) 100 MG tablet, TAKE 1 TABLET BY MOUTH ONCE AS NEEDED FOR MIGRAINE( MAY REPEAT IN 2 HOURS), Disp: 9 tablet, Rfl: 10;  tiZANidine (ZANAFLEX) 4 MG tablet, One p.o. B.i.d., Disp: 30 tablet, Rfl: 5 zolpidem (AMBIEN) 5 MG tablet, TAKE 1/2 TABLET BY MOUTH EVERY NIGHT AT BEDTIME, Disp: 60 tablet, Rfl: 3;  HYDROcodone-homatropine (HYCODAN) 5-1.5 MG/5ML syrup, Take 5 mLs by mouth every 8 (eight) hours as needed for cough., Disp: 120 mL, Rfl: 0  EXAM:  Filed Vitals:   03/28/13 1332  BP: 104/74  Pulse: 105  Temp: 99.4 F (37.4 C)    Body mass index is 24.33 kg/(m^2).  GENERAL: vitals reviewed and listed above, alert, oriented, appears well hydrated and in no acute distress  HEENT:  atraumatic, conjunttiva clear, no obvious abnormalities on inspection of external nose and ears, normal appearance of ear canals and TMs, clear nasal congestion, mild post oropharyngeal erythema with PND, no tonsillar edema or exudate, no sinus TTP  NECK: no obvious masses on inspection  LUNGS: clear to auscultation bilaterally, no wheezes, rales or rhonchi, good air movement  CV: HRRR, no peripheral edema  MS: moves all extremities without noticeable abnormality  PSYCH: pleasant and cooperative, no obvious depression or anxiety  ASSESSMENT AND PLAN:  Discussed the following assessment and plan:  Upper respiratory infection - Plan: HYDROcodone-homatropine (HYCODAN) 5-1.5 MG/5ML syrup  -given HPI  and exam findings today, a serious infection or illness is unlikely. We discussed potential etiologies, with VURI being most likely, and advised supportive care and monitoring. We discussed treatment side effects, likely course, antibiotic misuse, transmission, and signs of developing a serious illness. -of course, we advised to return or notify a doctor immediately if symptoms worsen or persist or new concerns arise.    Patient Instructions  INSTRUCTIONS FOR UPPER RESPIRATORY INFECTION:  -plenty of rest and fluids  -nasal saline wash 2-3 times daily (use prepackaged nasal saline or bottled/distilled water if making your own)   -can use sinex nasal spray for drainage and nasal congestion - but do NOT use longer then 3-4 days  -can use tylenol or ibuprofen as directed for aches and sorethroat  -in the winter time, using a humidifier at night is helpful (please follow cleaning instructions)  -if you are taking a cough medication - use only as directed, may also try a teaspoon of honey to coat the throat and throat lozenges  -for sore throat, salt water gargles can help  -follow up if you have fevers, facial pain, tooth pain, difficulty breathing or are worsening or not getting better in 5-7 days      Ranferi Clingan R.

## 2013-03-28 NOTE — Progress Notes (Signed)
Pre visit review using our clinic review tool, if applicable. No additional management support is needed unless otherwise documented below in the visit note. 

## 2013-03-28 NOTE — Patient Instructions (Signed)

## 2013-03-29 MED ORDER — DIAZEPAM 2 MG PO TABS: ORAL_TABLET | ORAL | Status: DC

## 2013-03-29 NOTE — Telephone Encounter (Signed)
RX called in .

## 2013-04-12 ENCOUNTER — Telehealth: Payer: Self-pay | Admitting: Family Medicine

## 2013-04-12 NOTE — Telephone Encounter (Signed)
Pt still has cough and would ike a refill on HYDROcodone-homatropine (HYCODAN) 5-1.5 MG/5ML syrup

## 2013-04-12 NOTE — Telephone Encounter (Signed)
Needs appt

## 2013-04-12 NOTE — Telephone Encounter (Signed)
Left a detailed message for pt that an appt is needed for further refills.

## 2013-04-13 ENCOUNTER — Ambulatory Visit (INDEPENDENT_AMBULATORY_CARE_PROVIDER_SITE_OTHER): Payer: BC Managed Care – PPO | Admitting: Family Medicine

## 2013-04-13 ENCOUNTER — Encounter: Payer: Self-pay | Admitting: Family Medicine

## 2013-04-13 VITALS — BP 124/76 | HR 91 | Temp 98.4°F | Wt 159.0 lb

## 2013-04-13 DIAGNOSIS — B009 Herpesviral infection, unspecified: Secondary | ICD-10-CM

## 2013-04-13 DIAGNOSIS — B001 Herpesviral vesicular dermatitis: Secondary | ICD-10-CM

## 2013-04-13 DIAGNOSIS — J069 Acute upper respiratory infection, unspecified: Secondary | ICD-10-CM

## 2013-04-13 MED ORDER — HYDROCODONE-HOMATROPINE 5-1.5 MG/5ML PO SYRP: 5.0000 mL | ORAL_SOLUTION | Freq: Three times a day (TID) | ORAL | Status: DC | PRN

## 2013-04-13 MED ORDER — VALACYCLOVIR HCL 1 G PO TABS: ORAL_TABLET | ORAL | Status: DC

## 2013-04-13 NOTE — Progress Notes (Signed)
   Subjective:    Patient ID: Cheryl Brooks, female    DOB: Mar 01, 1966, 48 y.o.   MRN: [id number]  HPI Patient seen with persistent cough. She developed viral type syndrome few weeks ago. She's had lingering cough which is frequently waking her at night. She's tried over-the-counter cough medicine without relief. She denies any wheezing. No dyspnea. No fever. Nonsmoker. No GERD symptoms.  Patient also has recurrent cold sores and requesting options for treatment.  Past Medical History  Diagnosis Date  . Asthma   . Anxiety   . Depression   . GERD (gastroesophageal reflux disease)   . WUJWJXBJ(478.2Headache(784.0)    Past Surgical History  Procedure Laterality Date  . Tonsillectomy  2007  . Eye surgery  1972  . Laparoscopic tubal ligation  10/03/2010    Procedure: LAPAROSCOPIC TUBAL LIGATION;  Surgeon: Miguel AschoffAllan Ross;  Location: [facility];  Service: Gynecology;  Laterality: Bilateral;    reports that she has quit smoking. She has never used smokeless tobacco. She reports that she drinks about 1.2 ounces of alcohol per week. She reports that she does not use illicit drugs. family history is not on file. Allergies  Allergen Reactions  . Citalopram Hydrobromide Nausea And Vomiting      Review of Systems  Constitutional: Negative for fever and chills.  HENT: Negative for congestion.   Respiratory: Positive for cough. Negative for shortness of breath and wheezing.   Neurological: Negative for headaches.       Objective:   Physical Exam  Constitutional: She appears well-developed and well-nourished.  HENT:  Right Ear: External ear normal.  Left Ear: External ear normal.  Mouth/Throat: Oropharynx is clear and moist.  Neck: Neck supple. No thyromegaly present.  Cardiovascular: Normal rate and regular rhythm.   Pulmonary/Chest: Effort normal and breath sounds normal. No respiratory distress. She has no wheezes. She has no rales.          Assessment & Plan:  #1 cough. She has nonfocal exam.  Suspect lingering acute viral bronchitis. No clear indication for antibiotics. We gave 1 refill of Hycodan cough syrup but avoid prolonged use. She uses only at night as needed. #2 recurrent cold sores. Valtrex 1 g 2 at onset of cold sores and repeat in 12 hours as needed.

## 2013-04-13 NOTE — Patient Instructions (Signed)
Acute Bronchitis Bronchitis is inflammation of the airways that extend from the windpipe into the lungs (bronchi). The inflammation often causes mucus to develop. This leads to a cough, which is the most common symptom of bronchitis.  In acute bronchitis, the condition usually develops suddenly and goes away over time, usually in a couple weeks. Smoking, allergies, and asthma can make bronchitis worse. Repeated episodes of bronchitis may cause further lung problems.  CAUSES Acute bronchitis is most often caused by the same virus that causes a cold. The virus can spread from person to person (contagious).  SIGNS AND SYMPTOMS   Cough.   Fever.   Coughing up mucus.   Body aches.   Chest congestion.   Chills.   Shortness of breath.   Sore throat.  DIAGNOSIS  Acute bronchitis is usually diagnosed through a physical exam. Tests, such as chest X-rays, are sometimes done to rule out other conditions.  TREATMENT  Acute bronchitis usually goes away in a couple weeks. Often times, no medical treatment is necessary. Medicines are sometimes given for relief of fever or cough. Antibiotics are usually not needed but may be prescribed in certain situations. In some cases, an inhaler may be recommended to help reduce shortness of breath and control the cough. A cool mist vaporizer may also be used to help thin bronchial secretions and make it easier to clear the chest.  HOME CARE INSTRUCTIONS  Get plenty of rest.   Drink enough fluids to keep your urine clear or pale yellow (unless you have a medical condition that requires fluid restriction). Increasing fluids may help thin your secretions and will prevent dehydration.   Only take over-the-counter or prescription medicines as directed by your health care provider.   Avoid smoking and secondhand smoke. Exposure to cigarette smoke or irritating chemicals will make bronchitis worse. If you are a smoker, consider using nicotine gum or skin  patches to help control withdrawal symptoms. Quitting smoking will help your lungs heal faster.   Reduce the chances of another bout of acute bronchitis by washing your hands frequently, avoiding people with cold symptoms, and trying not to touch your hands to your mouth, nose, or eyes.   Follow up with your health care provider as directed.  SEEK MEDICAL CARE IF: Your symptoms do not improve after 1 week of treatment.  SEEK IMMEDIATE MEDICAL CARE IF:  You develop an increased fever or chills.   You have chest pain.   You have severe shortness of breath.  You have bloody sputum.   You develop dehydration.  You develop fainting.  You develop repeated vomiting.  You develop a severe headache. MAKE SURE YOU:   Understand these instructions.  Will watch your condition.  Will get help right away if you are not doing well or get worse. Document Released: 04/17/2004 Document Revised: 11/10/2012 Document Reviewed: 08/31/2012 [employer] Patient Information 2014 [employer].  

## 2013-04-13 NOTE — Progress Notes (Signed)
Pre visit review using our clinic review tool, if applicable. No additional management support is needed unless otherwise documented below in the visit note. 

## 2013-05-02 ENCOUNTER — Telehealth: Payer: Self-pay | Admitting: Family Medicine

## 2013-05-02 MED ORDER — FLUTICASONE PROPIONATE HFA 44 MCG/ACT IN AERO: 2.0000 | INHALATION_SPRAY | Freq: Two times a day (BID) | RESPIRATORY_TRACT | Status: DC

## 2013-05-02 NOTE — Telephone Encounter (Signed)
Fax was received denying Pulmicort.  Pt must try and fail Asmanex and Flovent first.

## 2013-05-02 NOTE — Telephone Encounter (Signed)
RX sent to pharmacy and pt is aware 

## 2013-05-02 NOTE — Telephone Encounter (Signed)
Try Flovent 44 mcg 2 puffs twice daily

## 2013-05-31 ENCOUNTER — Telehealth: Payer: Self-pay | Admitting: Family Medicine

## 2013-05-31 NOTE — Telephone Encounter (Signed)
Last visit 04/13/13 Last refill 02/02/13 #30 0 refill

## 2013-05-31 NOTE — Telephone Encounter (Signed)
Pt needs new rx hydrocodone °

## 2013-05-31 NOTE — Telephone Encounter (Signed)
Refill once 

## 2013-06-01 MED ORDER — HYDROCODONE-ACETAMINOPHEN 7.5-325 MG PO TABS: ORAL_TABLET | ORAL | Status: DC

## 2013-06-01 NOTE — Telephone Encounter (Signed)
Pt aware that RX is ready for pick up  

## 2013-06-08 ENCOUNTER — Other Ambulatory Visit: Payer: Self-pay | Admitting: Family Medicine

## 2013-06-08 NOTE — Telephone Encounter (Signed)
Last visit 04/13/13 Last refill 03/29/13 #30 2 refill

## 2013-06-08 NOTE — Telephone Encounter (Signed)
Is she taking just once daily?  If so, should not need refill until 06-27-13 (filled 03-29-13 for #30 with 2 refills).

## 2013-06-27 ENCOUNTER — Ambulatory Visit (INDEPENDENT_AMBULATORY_CARE_PROVIDER_SITE_OTHER): Payer: BC Managed Care – PPO | Admitting: Family Medicine

## 2013-06-27 ENCOUNTER — Encounter: Payer: Self-pay | Admitting: Family Medicine

## 2013-06-27 VITALS — BP 114/76 | HR 88 | Wt 154.0 lb

## 2013-06-27 DIAGNOSIS — J069 Acute upper respiratory infection, unspecified: Secondary | ICD-10-CM

## 2013-06-27 DIAGNOSIS — J45901 Unspecified asthma with (acute) exacerbation: Secondary | ICD-10-CM

## 2013-06-27 MED ORDER — PREDNISONE 20 MG PO TABS: 40.0000 mg | ORAL_TABLET | Freq: Every day | ORAL | Status: DC

## 2013-06-27 MED ORDER — HYDROCODONE-HOMATROPINE 5-1.5 MG/5ML PO SYRP: 5.0000 mL | ORAL_SOLUTION | Freq: Three times a day (TID) | ORAL | Status: DC | PRN

## 2013-06-27 NOTE — Progress Notes (Signed)
Pre visit review using our clinic review tool, if applicable. No additional management support is needed unless otherwise documented below in the visit note. 

## 2013-06-27 NOTE — Patient Instructions (Signed)
Upper Respiratory Infection, Adult An upper respiratory infection (URI) is also sometimes known as the common cold. The upper respiratory tract includes the nose, sinuses, throat, trachea, and bronchi. Bronchi are the airways leading to the lungs. Most people improve within 1 week, but symptoms can last up to 2 weeks. A residual cough may last even longer.  CAUSES Many different viruses can infect the tissues lining the upper respiratory tract. The tissues become irritated and inflamed and often become very moist. Mucus production is also common. A cold is contagious. You can easily spread the virus to others by oral contact. This includes kissing, sharing a glass, coughing, or sneezing. Touching your mouth or nose and then touching a surface, which is then touched by another person, can also spread the virus. SYMPTOMS  Symptoms typically develop 1 to 3 days after you come in contact with a cold virus. Symptoms vary from person to person. They may include:  Runny nose.  Sneezing.  Nasal congestion.  Sinus irritation.  Sore throat.  Loss of voice (laryngitis).  Cough.  Fatigue.  Muscle aches.  Loss of appetite.  Headache.  Low-grade fever. DIAGNOSIS  You might diagnose your own cold based on familiar symptoms, since most people get a cold 2 to 3 times a year. Your caregiver can confirm this based on your exam. Most importantly, your caregiver can check that your symptoms are not due to another disease such as strep throat, sinusitis, pneumonia, asthma, or epiglottitis. Blood tests, throat tests, and X-rays are not necessary to diagnose a common cold, but they may sometimes be helpful in excluding other more serious diseases. Your caregiver will decide if any further tests are required. RISKS AND COMPLICATIONS  You may be at risk for a more severe case of the common cold if you smoke cigarettes, have chronic heart disease (such as heart failure) or lung disease (such as asthma), or if  you have a weakened immune system. The very young and very old are also at risk for more serious infections. Bacterial sinusitis, middle ear infections, and bacterial pneumonia can complicate the common cold. The common cold can worsen asthma and chronic obstructive pulmonary disease (COPD). Sometimes, these complications can require emergency medical care and may be life-threatening. PREVENTION  The best way to protect against getting a cold is to practice good hygiene. Avoid oral or hand contact with people with cold symptoms. Wash your hands often if contact occurs. There is no clear evidence that vitamin C, vitamin E, echinacea, or exercise reduces the chance of developing a cold. However, it is always recommended to get plenty of rest and practice good nutrition. TREATMENT  Treatment is directed at relieving symptoms. There is no cure. Antibiotics are not effective, because the infection is caused by a virus, not by bacteria. Treatment may include:  Increased fluid intake. Sports drinks offer valuable electrolytes, sugars, and fluids.  Breathing heated mist or steam (vaporizer or shower).  Eating chicken soup or other clear broths, and maintaining good nutrition.  Getting plenty of rest.  Using gargles or lozenges for comfort.  Controlling fevers with ibuprofen or acetaminophen as directed by your caregiver.  Increasing usage of your inhaler if you have asthma. Zinc gel and zinc lozenges, taken in the first 24 hours of the common cold, can shorten the duration and lessen the severity of symptoms. Pain medicines may help with fever, muscle aches, and throat pain. A variety of non-prescription medicines are available to treat congestion and runny nose. Your caregiver   can make recommendations and may suggest nasal or lung inhalers for other symptoms.  HOME CARE INSTRUCTIONS   Only take over-the-counter or prescription medicines for pain, discomfort, or fever as directed by your  caregiver.  Use a warm mist humidifier or inhale steam from a shower to increase air moisture. This may keep secretions moist and make it easier to breathe.  Drink enough water and fluids to keep your urine clear or pale yellow.  Rest as needed.  Return to work when your temperature has returned to normal or as your caregiver advises. You may need to stay home longer to avoid infecting others. You can also use a face mask and careful hand washing to prevent spread of the virus. SEEK MEDICAL CARE IF:   After the first few days, you feel you are getting worse rather than better.  You need your caregiver's advice about medicines to control symptoms.  You develop chills, worsening shortness of breath, or brown or red sputum. These may be signs of pneumonia.  You develop yellow or brown nasal discharge or pain in the face, especially when you bend forward. These may be signs of sinusitis.  You develop a fever, swollen neck glands, pain with swallowing, or white areas in the back of your throat. These may be signs of strep throat. SEEK IMMEDIATE MEDICAL CARE IF:   You have a fever.  You develop severe or persistent headache, ear pain, sinus pain, or chest pain.  You develop wheezing, a prolonged cough, cough up blood, or have a change in your usual mucus (if you have chronic lung disease).  You develop sore muscles or a stiff neck. Document Released: 09/03/2000 Document Revised: 06/02/2011 Document Reviewed: 07/12/2010 [facility] Patient Information 2014 [place], [place].  Asthma Attack Prevention Although there is no way to prevent asthma from starting, you can take steps to control the disease and reduce its symptoms. Learn about your asthma and how to control it. Take an active role to control your asthma by working with your health care provider to create and follow an asthma action plan. An asthma action plan guides you in:  Taking your medicines properly.  Avoiding things that set  off your asthma or make your asthma worse (asthma triggers).  Tracking your level of asthma control.  Responding to worsening asthma.  Seeking emergency care when needed. To track your asthma, keep records of your symptoms, check your peak flow number using a handheld device that shows how well air moves out of your lungs (peak flow meter), and get regular asthma checkups.  WHAT ARE SOME WAYS TO PREVENT AN ASTHMA ATTACK?  Take medicines as directed by your health care provider.  Keep track of your asthma symptoms and level of control.  With your health care provider, write a detailed plan for taking medicines and managing an asthma attack. Then be sure to follow your action plan. Asthma is an ongoing condition that needs regular monitoring and treatment.  Identify and avoid asthma triggers. Many outdoor allergens and irritants (such as pollen, mold, cold air, and air pollution) can trigger asthma attacks. Find out what your asthma triggers are and take steps to avoid them.  Monitor your breathing. Learn to recognize warning signs of an attack, such as coughing, wheezing, or shortness of breath. Your lung function may decrease before you notice any signs or symptoms, so regularly measure and record your peak airflow with a home peak flow meter.  Identify and treat attacks early. If you act quickly,  you are less likely to have a severe attack. You will also need less medicine to control your symptoms. When your peak flow measurements decrease and alert you to an upcoming attack, take your medicine as instructed and immediately stop any activity that may have triggered the attack. If your symptoms do not improve, get medical help.  Pay attention to increasing quick-relief inhaler use. If you find yourself relying on your quick-relief inhaler, your asthma is not under control. See your health care provider about adjusting your treatment. WHAT CAN MAKE MY SYMPTOMS WORSE? A number of common things  can set off or make your asthma symptoms worse and cause temporary increased inflammation of your airways. Keep track of your asthma symptoms for several weeks, detailing all the environmental and emotional factors that are linked with your asthma. When you have an asthma attack, go back to your asthma diary to see which factor, or combination of factors, might have contributed to it. Once you know what these factors are, you can take steps to control many of them. If you have allergies and asthma, it is important to take asthma prevention steps at home. Minimizing contact with the substance to which you are allergic will help prevent an asthma attack. Some triggers and ways to avoid these triggers are: Animal Dander:  Some people are allergic to the flakes of skin or dried saliva from animals with fur or feathers.   There is no such thing as a hypoallergenic dog or cat breed. All dogs or cats can cause allergies, even if they don't shed.  Keep these pets out of your home.  If you are not able to keep a pet outdoors, keep the pet out of your bedroom and other sleeping areas at all times, and keep the door closed.  Remove carpets and furniture covered with cloth from your home. If that is not possible, keep the pet away from fabric-covered furniture and carpets. Dust Mites: Many people with asthma are allergic to dust mites. Dust mites are tiny bugs that are found in every home in mattresses, pillows, carpets, fabric-covered furniture, bedcovers, clothes, stuffed toys, and other fabric-covered items.   Cover your mattress in a special dust-proof cover.  Cover your pillow in a special dust-proof cover, or wash the pillow each week in hot water. Water must be hotter than 130 F (54.4 C) to kill dust mites. Cold or warm water used with detergent and bleach can also be effective.  Wash the sheets and blankets on your bed each week in hot water.  Try not to sleep or lie on cloth-covered  cushions.  Call ahead when traveling and ask for a smoke-free hotel room. Bring your own bedding and pillows in case the hotel only supplies feather pillows and down comforters, which may contain dust mites and cause asthma symptoms.  Remove carpets from your bedroom and those laid on concrete, if you can.  Keep stuffed toys out of the bed, or wash the toys weekly in hot water or cooler water with detergent and bleach. Cockroaches: Many people with asthma are allergic to the droppings and remains of cockroaches.   Keep food and garbage in closed containers. Never leave food out.  Use poison baits, traps, powders, gels, or paste (for example, boric acid).  If a spray is used to kill cockroaches, stay out of the room until the odor goes away. Indoor Mold:  Fix leaky faucets, pipes, or other sources of water that have mold around them.  Clean  floors and moldy surfaces with a fungicide or diluted bleach.  Avoid using humidifiers, vaporizers, or swamp coolers. These can spread molds through the air. Pollen and Outdoor Mold:  When pollen or mold spore counts are high, try to keep your windows closed.  Stay indoors with windows closed from late morning to afternoon. Pollen and some mold spore counts are highest at that time.  Ask your health care provider whether you need to take anti-inflammatory medicine or increase your dose of the medicine before your allergy season starts. Other Irritants to Avoid:  Tobacco smoke is an irritant. If you smoke, ask your health care provider how you can quit. Ask family members to quit smoking too. Do not allow smoking in your home or car.  If possible, do not use a wood-burning stove, kerosene heater, or fireplace. Minimize exposure to all sources of smoke, including to incense, candles, fires, and fireworks.  Try to stay away from strong odors and sprays, such as perfume, talcum powder, hair spray, and paints.  Decrease humidity in your home and use  an indoor air cleaning device. Reduce indoor humidity to below 60%. Dehumidifiers or central air conditioners can do this.  Decrease house dust exposure by changing furnace and air cooler filters frequently.  Try to have someone else vacuum for you once or twice a week. Stay out of rooms while they are being vacuumed and for a short while afterward.  If you vacuum, use a dust mask from a hardware store, a double-layered or microfilter vacuum cleaner bag, or a vacuum cleaner with a HEPA filter.  Sulfites in foods and beverages can be irritants. Do not drink beer or wine or eat dried fruit, processed potatoes, or shrimp if they cause asthma symptoms.  Cold air can trigger an asthma attack. Cover your nose and mouth with a scarf on cold or windy days.  Several health conditions can make asthma more difficult to manage, including a runny nose, sinus infections, reflux disease, psychological stress, and sleep apnea. Work with your health care provider to manage these conditions.  Avoid close contact with people who have a respiratory infection such as a cold or the flu, since your asthma symptoms may get worse if you catch the infection. Wash your hands thoroughly after touching items that may have been handled by people with a respiratory infection.  Get a flu shot every year to protect against the flu virus, which often makes asthma worse for days or weeks. Also get a pneumonia shot if you have not previously had one. Unlike the flu shot, the pneumonia shot does not need to be given yearly. Medicines:  Talk to your health care provider about whether it is safe for you to take aspirin or non-steroidal anti-inflammatory medicines (NSAIDs). In a small number of people with asthma, aspirin and NSAIDs can cause asthma attacks. These medicines must be avoided by people who have known aspirin-sensitive asthma. It is important that people with aspirin-sensitive asthma read labels of all over-the-counter  medicines used to treat pain, colds, coughs, and fever.  Beta blockers and ACE inhibitors are other medicines you should discuss with your health care provider. HOW CAN I FIND OUT WHAT I AM ALLERGIC TO? Ask your asthma health care provider about allergy skin testing or blood testing (the RAST test) to identify the allergens to which you are sensitive. If you are found to have allergies, the most important thing to do is to try to avoid exposure to any allergens that you  are sensitive to as much as possible. Other treatments for allergies, such as medicines and allergy shots (immunotherapy) are available.  CAN I EXERCISE? Follow your health care provider's advice regarding asthma treatment before exercising. It is important to maintain a regular exercise program, but vigorous exercise, or exercise in cold, humid, or dry environments can cause asthma attacks, especially for those people who have exercise-induced asthma. Document Released: 02/26/2009 Document Revised: 11/10/2012 Document Reviewed: 09/15/2012 [facility] Patient Information 2014 [place], [place].

## 2013-06-27 NOTE — Progress Notes (Signed)
Chief Complaint  Patient presents with  . Sore Throat    chest congestion, chest pressure, cough     HPI:  -started: 3-4 days ago -symptoms:nasal congestion, sore throat, cough - some milf chest tightness with cough and wheezing, mild SOB, clear mucus -denies:fever, SOB, NVD, tooth pain -has tried: takes her maintenace flovent daily, but has not tried her albuterol -sick contacts/travel/risks: denies flu exposure, tick exposure or or Ebola risks -Hx of: allergies and asthma  ROS: See pertinent positives and negatives per HPI.  Past Medical History  Diagnosis Date  . Asthma   . Anxiety   . Depression   . GERD (gastroesophageal reflux disease)   . NUUVOZDG(644.0)     Past Surgical History  Procedure Laterality Date  . Tonsillectomy  2007  . Eye surgery  1972  . Laparoscopic tubal ligation  10/03/2010    Procedure: LAPAROSCOPIC TUBAL LIGATION;  Surgeon: Miguel Aschoff;  Location: [facility];  Service: Gynecology;  Laterality: Bilateral;    No family history on file.  History   Social History  . Marital Status: Divorced    Spouse Name: N/A    Number of Children: N/A  . Years of Education: N/A   Social History Main Topics  . Smoking status: Former Games developer  . Smokeless tobacco: Never Used     Comment: only smoked 6 months while in college.  . Alcohol Use: 1.2 oz/week    1 Glasses of wine, 1 Shots of liquor per week     Comment: socially  . Drug Use: No  . Sexual Activity: None   Other Topics Concern  . None   Social History Narrative  . None    Current outpatient prescriptions:albuterol (PROVENTIL,VENTOLIN) 90 MCG/ACT inhaler, Inhale 2 puffs into the lungs every 6 (six) hours as needed., Disp: 17 g, Rfl: 2;  aspirin 81 MG tablet, Take 81 mg by mouth daily.  , Disp: , Rfl: ;  budesonide (PULMICORT) 180 MCG/ACT inhaler, Inhale 1 puff into the lungs 2 (two) times daily., Disp: 3 Inhaler, Rfl: 3;  diazepam (VALIUM) 2 MG tablet, TAKE ONE TABLET BY MOUTH EVERY 6 HOURS, Disp: 30  tablet, Rfl: 0 doxepin (SINEQUAN) 10 MG capsule, Take 10 mg by mouth 2 (two) times daily., Disp: , Rfl: ;  fluticasone (FLOVENT HFA) 44 MCG/ACT inhaler, Inhale 2 puffs into the lungs 2 (two) times daily., Disp: 1 Inhaler, Rfl: 5;  HYDROcodone-acetaminophen (NORCO) 7.5-325 MG per tablet, TAKE 1/2 TO 1 TABLET BY MOUTH EVERY 4 HOURS AS NEEDED FOR MIGRAINE, Disp: 30 tablet, Rfl: 0;  LO LOESTRIN FE 1 MG-10 MCG / 10 MCG tablet, , Disp: , Rfl:  montelukast (SINGULAIR) 10 MG tablet, TAKE 1 TABLET BY MOUTH AT BEDTIME, Disp: 100 tablet, Rfl: 3;  omeprazole (PRILOSEC) 20 MG capsule, Take 1 capsule (20 mg total) by mouth daily., Disp: 100 capsule, Rfl: 3;  SUMAtriptan (IMITREX) 100 MG tablet, TAKE 1 TABLET BY MOUTH ONCE AS NEEDED FOR MIGRAINE( MAY REPEAT IN 2 HOURS), Disp: 9 tablet, Rfl: 10;  tiZANidine (ZANAFLEX) 4 MG tablet, One p.o. B.i.d., Disp: 30 tablet, Rfl: 5 valACYclovir (VALTREX) 1000 MG tablet, 2 at onset of cold sore and repeat 2 in 12 hours, Disp: 30 tablet, Rfl: 1;  zolpidem (AMBIEN) 5 MG tablet, TAKE 1/2 TABLET BY MOUTH EVERY NIGHT AT BEDTIME, Disp: 60 tablet, Rfl: 3;  HYDROcodone-homatropine (HYCODAN) 5-1.5 MG/5ML syrup, Take 5 mLs by mouth every 8 (eight) hours as needed for cough., Disp: 120 mL, Rfl: 0 predniSONE (DELTASONE) 20  MG tablet, Take 2 tablets (40 mg total) by mouth daily with breakfast., Disp: 8 tablet, Rfl: 0  EXAM:  Filed Vitals:   06/27/13 1315  BP: 114/76  Pulse: 88    Body mass index is 23.42 kg/(m^2).  GENERAL: vitals reviewed and listed above, alert, oriented, appears well hydrated and in no acute distress  HEENT: atraumatic, conjunttiva clear, no obvious abnormalities on inspection of external nose and ears, normal appearance of ear canals and TMs, clear nasal congestion, mild post oropharyngeal erythema with PND, no tonsillar edema or exudate, no sinus TTP  NECK: no obvious masses on inspection  LUNGS: clear to auscultation bilaterally, no wheezes, rales or rhonchi,  good air movement  CV: HRRR, no peripheral edema  MS: moves all extremities without noticeable abnormality  PSYCH: pleasant and cooperative, no obvious depression or anxiety  ASSESSMENT AND PLAN:  Discussed the following assessment and plan:  Viral URI - Plan: HYDROcodone-homatropine (HYCODAN) 5-1.5 MG/5ML syrup  Asthma with acute exacerbation - Plan: predniSONE (DELTASONE) 20 MG tablet  -given HPI and exam findings today, a serious infection or illness is unlikely. We discussed potential etiologies, with VURI being most likely, and advised supportive care and monitoring. We discussed treatment side effects, likely course, antibiotic misuse, transmission, and signs of developing a serious illness. -no signs of resp distress, likely viral with asthma exacerbation mild, trial of alb and prednisone after discussion risks/benefits -of course, we advised to return or notify a doctor immediately if symptoms worsen or persist or new concerns arise.    Patient Instructions  Upper Respiratory Infection, Adult An upper respiratory infection (URI) is also sometimes known as the common cold. The upper respiratory tract includes the nose, sinuses, throat, trachea, and bronchi. Bronchi are the airways leading to the lungs. Most people improve within 1 week, but symptoms can last up to 2 weeks. A residual cough may last even longer.  CAUSES Many different viruses can infect the tissues lining the upper respiratory tract. The tissues become irritated and inflamed and often become very moist. Mucus production is also common. A cold is contagious. You can easily spread the virus to others by oral contact. This includes kissing, sharing a glass, coughing, or sneezing. Touching your mouth or nose and then touching a surface, which is then touched by another person, can also spread the virus. SYMPTOMS  Symptoms typically develop 1 to 3 days after you come in contact with a cold virus. Symptoms vary from  person to person. They may include:  Runny nose.  Sneezing.  Nasal congestion.  Sinus irritation.  Sore throat.  Loss of voice (laryngitis).  Cough.  Fatigue.  Muscle aches.  Loss of appetite.  Headache.  Low-grade fever. DIAGNOSIS  You might diagnose your own cold based on familiar symptoms, since most people get a cold 2 to 3 times a year. Your caregiver can confirm this based on your exam. Most importantly, your caregiver can check that your symptoms are not due to another disease such as strep throat, sinusitis, pneumonia, asthma, or epiglottitis. Blood tests, throat tests, and X-rays are not necessary to diagnose a common cold, but they may sometimes be helpful in excluding other more serious diseases. Your caregiver will decide if any further tests are required. RISKS AND COMPLICATIONS  You may be at risk for a more severe case of the common cold if you smoke cigarettes, have chronic heart disease (such as heart failure) or lung disease (such as asthma), or if you have a weakened  immune system. The very young and very old are also at risk for more serious infections. Bacterial sinusitis, middle ear infections, and bacterial pneumonia can complicate the common cold. The common cold can worsen asthma and chronic obstructive pulmonary disease (COPD). Sometimes, these complications can require emergency medical care and may be life-threatening. PREVENTION  The best way to protect against getting a cold is to practice good hygiene. Avoid oral or hand contact with people with cold symptoms. Wash your hands often if contact occurs. There is no clear evidence that vitamin C, vitamin E, echinacea, or exercise reduces the chance of developing a cold. However, it is always recommended to get plenty of rest and practice good nutrition. TREATMENT  Treatment is directed at relieving symptoms. There is no cure. Antibiotics are not effective, because the infection is caused by a virus, not by  bacteria. Treatment may include:  Increased fluid intake. Sports drinks offer valuable electrolytes, sugars, and fluids.  Breathing heated mist or steam (vaporizer or shower).  Eating chicken soup or other clear broths, and maintaining good nutrition.  Getting plenty of rest.  Using gargles or lozenges for comfort.  Controlling fevers with ibuprofen or acetaminophen as directed by your caregiver.  Increasing usage of your inhaler if you have asthma. Zinc gel and zinc lozenges, taken in the first 24 hours of the common cold, can shorten the duration and lessen the severity of symptoms. Pain medicines may help with fever, muscle aches, and throat pain. A variety of non-prescription medicines are available to treat congestion and runny nose. Your caregiver can make recommendations and may suggest nasal or lung inhalers for other symptoms.  HOME CARE INSTRUCTIONS   Only take over-the-counter or prescription medicines for pain, discomfort, or fever as directed by your caregiver.  Use a warm mist humidifier or inhale steam from a shower to increase air moisture. This may keep secretions moist and make it easier to breathe.  Drink enough water and fluids to keep your urine clear or pale yellow.  Rest as needed.  Return to work when your temperature has returned to normal or as your caregiver advises. You may need to stay home longer to avoid infecting others. You can also use a face mask and careful hand washing to prevent spread of the virus. SEEK MEDICAL CARE IF:   After the first few days, you feel you are getting worse rather than better.  You need your caregiver's advice about medicines to control symptoms.  You develop chills, worsening shortness of breath, or brown or red sputum. These may be signs of pneumonia.  You develop yellow or brown nasal discharge or pain in the face, especially when you bend forward. These may be signs of sinusitis.  You develop a fever, swollen neck  glands, pain with swallowing, or white areas in the back of your throat. These may be signs of strep throat. SEEK IMMEDIATE MEDICAL CARE IF:   You have a fever.  You develop severe or persistent headache, ear pain, sinus pain, or chest pain.  You develop wheezing, a prolonged cough, cough up blood, or have a change in your usual mucus (if you have chronic lung disease).  You develop sore muscles or a stiff neck. Document Released: 09/03/2000 Document Revised: 06/02/2011 Document Reviewed: 07/12/2010 [facility] Patient Information 2014 [place], [place].  Asthma Attack Prevention Although there is no way to prevent asthma from starting, you can take steps to control the disease and reduce its symptoms. Learn about your asthma and how  to control it. Take an active role to control your asthma by working with your health care provider to create and follow an asthma action plan. An asthma action plan guides you in:  Taking your medicines properly.  Avoiding things that set off your asthma or make your asthma worse (asthma triggers).  Tracking your level of asthma control.  Responding to worsening asthma.  Seeking emergency care when needed. To track your asthma, keep records of your symptoms, check your peak flow number using a handheld device that shows how well air moves out of your lungs (peak flow meter), and get regular asthma checkups.  WHAT ARE SOME WAYS TO PREVENT AN ASTHMA ATTACK?  Take medicines as directed by your health care provider.  Keep track of your asthma symptoms and level of control.  With your health care provider, write a detailed plan for taking medicines and managing an asthma attack. Then be sure to follow your action plan. Asthma is an ongoing condition that needs regular monitoring and treatment.  Identify and avoid asthma triggers. Many outdoor allergens and irritants (such as pollen, mold, cold air, and air pollution) can trigger asthma attacks. Find out what  your asthma triggers are and take steps to avoid them.  Monitor your breathing. Learn to recognize warning signs of an attack, such as coughing, wheezing, or shortness of breath. Your lung function may decrease before you notice any signs or symptoms, so regularly measure and record your peak airflow with a home peak flow meter.  Identify and treat attacks early. If you act quickly, you are less likely to have a severe attack. You will also need less medicine to control your symptoms. When your peak flow measurements decrease and alert you to an upcoming attack, take your medicine as instructed and immediately stop any activity that may have triggered the attack. If your symptoms do not improve, get medical help.  Pay attention to increasing quick-relief inhaler use. If you find yourself relying on your quick-relief inhaler, your asthma is not under control. See your health care provider about adjusting your treatment. WHAT CAN MAKE MY SYMPTOMS WORSE? A number of common things can set off or make your asthma symptoms worse and cause temporary increased inflammation of your airways. Keep track of your asthma symptoms for several weeks, detailing all the environmental and emotional factors that are linked with your asthma. When you have an asthma attack, go back to your asthma diary to see which factor, or combination of factors, might have contributed to it. Once you know what these factors are, you can take steps to control many of them. If you have allergies and asthma, it is important to take asthma prevention steps at home. Minimizing contact with the substance to which you are allergic will help prevent an asthma attack. Some triggers and ways to avoid these triggers are: Animal Dander:  Some people are allergic to the flakes of skin or dried saliva from animals with fur or feathers.   There is no such thing as a hypoallergenic dog or cat breed. All dogs or cats can cause allergies, even if they  don't shed.  Keep these pets out of your home.  If you are not able to keep a pet outdoors, keep the pet out of your bedroom and other sleeping areas at all times, and keep the door closed.  Remove carpets and furniture covered with cloth from your home. If that is not possible, keep the pet away from fabric-covered furniture and  carpets. Dust Mites: Many people with asthma are allergic to dust mites. Dust mites are tiny bugs that are found in every home in mattresses, pillows, carpets, fabric-covered furniture, bedcovers, clothes, stuffed toys, and other fabric-covered items.   Cover your mattress in a special dust-proof cover.  Cover your pillow in a special dust-proof cover, or wash the pillow each week in hot water. Water must be hotter than 130 F (54.4 C) to kill dust mites. Cold or warm water used with detergent and bleach can also be effective.  Wash the sheets and blankets on your bed each week in hot water.  Try not to sleep or lie on cloth-covered cushions.  Call ahead when traveling and ask for a smoke-free hotel room. Bring your own bedding and pillows in case the hotel only supplies feather pillows and down comforters, which may contain dust mites and cause asthma symptoms.  Remove carpets from your bedroom and those laid on concrete, if you can.  Keep stuffed toys out of the bed, or wash the toys weekly in hot water or cooler water with detergent and bleach. Cockroaches: Many people with asthma are allergic to the droppings and remains of cockroaches.   Keep food and garbage in closed containers. Never leave food out.  Use poison baits, traps, powders, gels, or paste (for example, boric acid).  If a spray is used to kill cockroaches, stay out of the room until the odor goes away. Indoor Mold:  Fix leaky faucets, pipes, or other sources of water that have mold around them.  Clean floors and moldy surfaces with a fungicide or diluted bleach.  Avoid using  humidifiers, vaporizers, or swamp coolers. These can spread molds through the air. Pollen and Outdoor Mold:  When pollen or mold spore counts are high, try to keep your windows closed.  Stay indoors with windows closed from late morning to afternoon. Pollen and some mold spore counts are highest at that time.  Ask your health care provider whether you need to take anti-inflammatory medicine or increase your dose of the medicine before your allergy season starts. Other Irritants to Avoid:  Tobacco smoke is an irritant. If you smoke, ask your health care provider how you can quit. Ask family members to quit smoking too. Do not allow smoking in your home or car.  If possible, do not use a wood-burning stove, kerosene heater, or fireplace. Minimize exposure to all sources of smoke, including to incense, candles, fires, and fireworks.  Try to stay away from strong odors and sprays, such as perfume, talcum powder, hair spray, and paints.  Decrease humidity in your home and use an indoor air cleaning device. Reduce indoor humidity to below 60%. Dehumidifiers or central air conditioners can do this.  Decrease house dust exposure by changing furnace and air cooler filters frequently.  Try to have someone else vacuum for you once or twice a week. Stay out of rooms while they are being vacuumed and for a short while afterward.  If you vacuum, use a dust mask from a hardware store, a double-layered or microfilter vacuum cleaner bag, or a vacuum cleaner with a HEPA filter.  Sulfites in foods and beverages can be irritants. Do not drink beer or wine or eat dried fruit, processed potatoes, or shrimp if they cause asthma symptoms.  Cold air can trigger an asthma attack. Cover your nose and mouth with a scarf on cold or windy days.  Several health conditions can make asthma more difficult to manage,  including a runny nose, sinus infections, reflux disease, psychological stress, and sleep apnea. Work with  your health care provider to manage these conditions.  Avoid close contact with people who have a respiratory infection such as a cold or the flu, since your asthma symptoms may get worse if you catch the infection. Wash your hands thoroughly after touching items that may have been handled by people with a respiratory infection.  Get a flu shot every year to protect against the flu virus, which often makes asthma worse for days or weeks. Also get a pneumonia shot if you have not previously had one. Unlike the flu shot, the pneumonia shot does not need to be given yearly. Medicines:  Talk to your health care provider about whether it is safe for you to take aspirin or non-steroidal anti-inflammatory medicines (NSAIDs). In a small number of people with asthma, aspirin and NSAIDs can cause asthma attacks. These medicines must be avoided by people who have known aspirin-sensitive asthma. It is important that people with aspirin-sensitive asthma read labels of all over-the-counter medicines used to treat pain, colds, coughs, and fever.  Beta blockers and ACE inhibitors are other medicines you should discuss with your health care provider. HOW CAN I FIND OUT WHAT I AM ALLERGIC TO? Ask your asthma health care provider about allergy skin testing or blood testing (the RAST test) to identify the allergens to which you are sensitive. If you are found to have allergies, the most important thing to do is to try to avoid exposure to any allergens that you are sensitive to as much as possible. Other treatments for allergies, such as medicines and allergy shots (immunotherapy) are available.  CAN I EXERCISE? Follow your health care provider's advice regarding asthma treatment before exercising. It is important to maintain a regular exercise program, but vigorous exercise, or exercise in cold, humid, or dry environments can cause asthma attacks, especially for those people who have exercise-induced asthma. Document  Released: 02/26/2009 Document Revised: 11/10/2012 Document Reviewed: 09/15/2012 [facility] Patient Information 2014 [place], Lona Kettle, Dahlia Client R.

## 2013-06-29 ENCOUNTER — Other Ambulatory Visit: Payer: Self-pay | Admitting: Family Medicine

## 2013-06-29 NOTE — Telephone Encounter (Signed)
Refill once 

## 2013-07-13 ENCOUNTER — Telehealth: Payer: Self-pay | Admitting: Family Medicine

## 2013-07-13 NOTE — Telephone Encounter (Signed)
Pt is scheduled for thur 4/23 for inj

## 2013-07-13 NOTE — Telephone Encounter (Signed)
[place] traveling [place]. Advised to get a hep inj the patient would like to know ifits too late or can she schedule?

## 2013-07-13 NOTE — Telephone Encounter (Signed)
If she is referring to Hep A OK to still get.

## 2013-07-13 NOTE — Telephone Encounter (Signed)
Can you call patient and schedule a nurse visit for her to get the injection.

## 2013-07-14 ENCOUNTER — Ambulatory Visit (INDEPENDENT_AMBULATORY_CARE_PROVIDER_SITE_OTHER): Payer: BC Managed Care – PPO | Admitting: Family Medicine

## 2013-07-14 DIAGNOSIS — Z23 Encounter for immunization: Secondary | ICD-10-CM

## 2013-07-29 ENCOUNTER — Other Ambulatory Visit: Payer: Self-pay | Admitting: Obstetrics and Gynecology

## 2013-08-01 ENCOUNTER — Telehealth: Payer: Self-pay | Admitting: Family Medicine

## 2013-08-01 MED ORDER — HYDROCODONE-ACETAMINOPHEN 7.5-325 MG PO TABS: ORAL_TABLET | ORAL | Status: DC

## 2013-08-01 NOTE — Telephone Encounter (Signed)
Left message on patient VM that RX is ready for pick up

## 2013-08-01 NOTE — Telephone Encounter (Signed)
Last refill 06/01/13 #30 0 refill Last visit 06/27/13 saw Dr. Selena BattenKim Last visit 01/11/13 saw Dr. Caryl NeverBurchette

## 2013-08-01 NOTE — Telephone Encounter (Signed)
Refill OK

## 2013-08-01 NOTE — Telephone Encounter (Signed)
Pt needs new rx hydrocodone and ambien into [facility] [place] [phone]

## 2013-08-05 ENCOUNTER — Other Ambulatory Visit: Payer: Self-pay | Admitting: Family Medicine

## 2013-08-05 NOTE — Telephone Encounter (Signed)
Please advise refill, last Rx July 2014 # 60 with 3 refills.

## 2013-08-07 NOTE — Telephone Encounter (Signed)
Refill times 3 

## 2013-08-08 ENCOUNTER — Other Ambulatory Visit: Payer: Self-pay | Admitting: Family Medicine

## 2013-08-08 ENCOUNTER — Other Ambulatory Visit: Payer: Self-pay

## 2013-08-08 DIAGNOSIS — Z1231 Encounter for screening mammogram for malignant neoplasm of breast: Secondary | ICD-10-CM

## 2013-08-09 ENCOUNTER — Telehealth: Payer: Self-pay | Admitting: Family Medicine

## 2013-08-09 ENCOUNTER — Telehealth: Payer: Self-pay

## 2013-08-09 DIAGNOSIS — J45909 Unspecified asthma, uncomplicated: Secondary | ICD-10-CM

## 2013-08-09 MED ORDER — ALBUTEROL SULFATE HFA 108 (90 BASE) MCG/ACT IN AERS: 2.0000 | INHALATION_SPRAY | Freq: Four times a day (QID) | RESPIRATORY_TRACT | Status: DC | PRN

## 2013-08-09 NOTE — Telephone Encounter (Signed)
Rx sent to pharmacy   

## 2013-08-09 NOTE — Telephone Encounter (Signed)
Refill request: Anucort HC 25mg  supp. Qty. 12 Unwrap and insert one suppository rectally twice daily [facility]  Last visit 04/13/13 No refill

## 2013-08-09 NOTE — Telephone Encounter (Signed)
[id number] [place], [place] - [address] is requesting re-fill on albuterol (PROVENTIL,VENTOLIN) 90 MCG/ACT inhaler

## 2013-08-10 MED ORDER — HYDROCORTISONE ACETATE 25 MG RE SUPP: RECTAL | Status: DC

## 2013-08-10 NOTE — Telephone Encounter (Signed)
Rx sent to pharmacy   

## 2013-08-10 NOTE — Telephone Encounter (Signed)
Refill OK

## 2013-08-12 ENCOUNTER — Telehealth: Payer: Self-pay | Admitting: Family Medicine

## 2013-08-12 MED ORDER — ALBUTEROL SULFATE HFA 108 (90 BASE) MCG/ACT IN AERS: 2.0000 | INHALATION_SPRAY | Freq: Four times a day (QID) | RESPIRATORY_TRACT | Status: AC | PRN

## 2013-08-12 NOTE — Telephone Encounter (Signed)
PA for Proventil HFA has been denied, pt must try and fail Proair HFA first.

## 2013-08-12 NOTE — Telephone Encounter (Signed)
Use Proair

## 2013-08-12 NOTE — Telephone Encounter (Signed)
Rx changed and sent new Rx to pharmacy

## 2013-09-06 ENCOUNTER — Ambulatory Visit
Admission: RE | Admit: 2013-09-06 | Discharge: 2013-09-06 | Disposition: A | Discharge status: Home / Self Care | Payer: BC Managed Care – PPO | Source: Ambulatory Visit

## 2013-09-06 DIAGNOSIS — Z1231 Encounter for screening mammogram for malignant neoplasm of breast: Secondary | ICD-10-CM

## 2013-09-21 ENCOUNTER — Other Ambulatory Visit: Payer: Self-pay | Admitting: Family Medicine

## 2013-10-07 ENCOUNTER — Telehealth: Payer: Self-pay | Admitting: Family Medicine

## 2013-10-07 MED ORDER — HYDROCODONE-ACETAMINOPHEN 7.5-325 MG PO TABS: ORAL_TABLET | ORAL | Status: DC

## 2013-10-07 NOTE — Telephone Encounter (Signed)
Last visit 06/27/13 Last refill 08/01/13 #30 0 refill

## 2013-10-07 NOTE — Telephone Encounter (Signed)
Pt aware that RX is ready for pick up  

## 2013-10-07 NOTE — Telephone Encounter (Signed)
Pt needs new rx hydrocodone °

## 2013-10-07 NOTE — Telephone Encounter (Signed)
Refill OK

## 2013-10-10 ENCOUNTER — Other Ambulatory Visit: Payer: Self-pay | Admitting: Family Medicine

## 2013-10-11 NOTE — Telephone Encounter (Signed)
Last visit 06/27/13 Last refill 08/08/13 #45 0

## 2013-10-12 NOTE — Telephone Encounter (Signed)
Refill once.  Try to avoid regular use. 

## 2013-10-18 ENCOUNTER — Telehealth: Payer: Self-pay | Admitting: Family Medicine

## 2013-10-18 MED ORDER — NITROFURANTOIN MACROCRYSTAL 50 MG PO CAPS: ORAL_CAPSULE | ORAL | Status: DC

## 2013-10-18 NOTE — Telephone Encounter (Signed)
OK to refill #30 with one refill.  Take one tablet after intercourse.

## 2013-10-18 NOTE — Telephone Encounter (Signed)
Pt is requesting a new rx for nitrofurantoin macro 50 mg, pt states she has not needing the meds in years, but she is now seeing a problem with uti after having sex, wanted to know if dr. Caryl Neverburchette could just call it in or does she need to be seen

## 2013-10-18 NOTE — Telephone Encounter (Signed)
Rx sent to pharmacy   

## 2013-11-01 ENCOUNTER — Other Ambulatory Visit: Payer: Self-pay | Admitting: Family Medicine

## 2013-11-02 NOTE — Telephone Encounter (Signed)
Valium  Last visit 06-27-13 Last refill 10/12/13 #30 0 refill

## 2013-11-03 ENCOUNTER — Other Ambulatory Visit: Payer: Self-pay | Admitting: Family Medicine

## 2013-11-03 NOTE — Telephone Encounter (Signed)
Refills OK. 

## 2013-11-07 ENCOUNTER — Other Ambulatory Visit: Payer: Self-pay | Admitting: Family Medicine

## 2013-11-07 NOTE — Telephone Encounter (Signed)
Last visit 06/27/13 Last refill 08/08/13 #45 0 refill

## 2013-11-07 NOTE — Telephone Encounter (Signed)
Refill OK

## 2013-12-13 ENCOUNTER — Other Ambulatory Visit: Payer: Self-pay | Admitting: Family Medicine

## 2013-12-13 NOTE — Telephone Encounter (Signed)
Refill once.  Avoid regular use. 

## 2013-12-13 NOTE — Telephone Encounter (Signed)
Last refill 8/13/5 #30 0 refill Last visit 06/27/13

## 2013-12-21 ENCOUNTER — Encounter: Payer: Self-pay | Admitting: *Deleted

## 2013-12-21 ENCOUNTER — Other Ambulatory Visit (INDEPENDENT_AMBULATORY_CARE_PROVIDER_SITE_OTHER): Payer: BC Managed Care – PPO

## 2013-12-21 DIAGNOSIS — Z0000 Encounter for general adult medical examination without abnormal findings: Secondary | ICD-10-CM

## 2013-12-21 DIAGNOSIS — E785 Hyperlipidemia, unspecified: Secondary | ICD-10-CM

## 2013-12-21 LAB — POCT URINALYSIS DIPSTICK
Bilirubin, UA: NEGATIVE
Glucose, UA: NEGATIVE
KETONES UA: NEGATIVE
Leukocytes, UA: NEGATIVE
Nitrite, UA: NEGATIVE
PH UA: 7
PROTEIN UA: NEGATIVE
RBC UA: NEGATIVE
Spec Grav, UA: 1.01
UROBILINOGEN UA: 0.2

## 2013-12-21 LAB — CBC WITH DIFFERENTIAL/PLATELET
BASOS ABS: 0 10*3/uL (ref 0.0–0.1)
Basophils Relative: 0.4 % (ref 0.0–3.0)
EOS ABS: 0.1 10*3/uL (ref 0.0–0.7)
Eosinophils Relative: 1.5 % (ref 0.0–5.0)
HCT: 41.6 % (ref 36.0–46.0)
Hemoglobin: 13.9 g/dL (ref 12.0–15.0)
Lymphocytes Relative: 46.2 % — ABNORMAL HIGH (ref 12.0–46.0)
Lymphs Abs: 4.1 10*3/uL — ABNORMAL HIGH (ref 0.7–4.0)
MCHC: 33.5 g/dL (ref 30.0–36.0)
MCV: 87.1 fl (ref 78.0–100.0)
MONO ABS: 0.4 10*3/uL (ref 0.1–1.0)
Monocytes Relative: 5 % (ref 3.0–12.0)
NEUTROS PCT: 46.9 % (ref 43.0–77.0)
Neutro Abs: 4.1 10*3/uL (ref 1.4–7.7)
PLATELETS: 348 10*3/uL (ref 150.0–400.0)
RBC: 4.77 Mil/uL (ref 3.87–5.11)
RDW: 14 % (ref 11.5–15.5)
WBC: 8.8 10*3/uL (ref 4.0–10.5)

## 2013-12-21 LAB — TSH: TSH: 1.11 u[IU]/mL (ref 0.35–4.50)

## 2013-12-21 LAB — HEPATIC FUNCTION PANEL
ALT: 14 U/L (ref 0–35)
AST: 17 U/L (ref 0–37)
Albumin: 4 g/dL (ref 3.5–5.2)
Alkaline Phosphatase: 51 U/L (ref 39–117)
BILIRUBIN DIRECT: 0 mg/dL (ref 0.0–0.3)
BILIRUBIN TOTAL: 0.5 mg/dL (ref 0.2–1.2)
Total Protein: 7.7 g/dL (ref 6.0–8.3)

## 2013-12-21 LAB — LDL CHOLESTEROL, DIRECT: LDL DIRECT: 157.6 mg/dL

## 2013-12-21 LAB — LIPID PANEL
CHOL/HDL RATIO: 7
Cholesterol: 235 mg/dL — ABNORMAL HIGH (ref 0–200)
HDL: 35.2 mg/dL — ABNORMAL LOW (ref >39.00)
NonHDL: 199.8
Triglycerides: 278 mg/dL — ABNORMAL HIGH (ref 0.0–149.0)
VLDL: 55.6 mg/dL — AB (ref 0.0–40.0)

## 2013-12-21 LAB — BASIC METABOLIC PANEL
BUN: 15 mg/dL (ref 6–23)
CHLORIDE: 103 meq/L (ref 96–112)
CO2: 27 meq/L (ref 19–32)
CREATININE: 1 mg/dL (ref 0.4–1.2)
Calcium: 9.4 mg/dL (ref 8.4–10.5)
GFR: 64.17 mL/min (ref >60.00)
GLUCOSE: 81 mg/dL (ref 70–99)
Potassium: 5 mEq/L (ref 3.5–5.1)
Sodium: 138 mEq/L (ref 135–145)

## 2013-12-26 ENCOUNTER — Telehealth: Payer: Self-pay | Admitting: Family Medicine

## 2013-12-26 MED ORDER — HYDROCODONE-ACETAMINOPHEN 7.5-325 MG PO TABS: ORAL_TABLET | ORAL | Status: DC

## 2013-12-26 NOTE — Telephone Encounter (Signed)
Pt is aware that RX is ready for pick up. 

## 2013-12-26 NOTE — Telephone Encounter (Signed)
Pt request refill of the following: HYDROcodone-acetaminophen (NORCO) 7.5-325 MG per tablet ° ° ° °Phamacy: °   Pick up  °

## 2013-12-26 NOTE — Telephone Encounter (Signed)
Last visit 06/27/13 Last refill 10/07/13 #30 0 refills

## 2013-12-26 NOTE — Telephone Encounter (Signed)
Refill OK.  We are glad she has been able to scale back.

## 2013-12-29 ENCOUNTER — Ambulatory Visit (INDEPENDENT_AMBULATORY_CARE_PROVIDER_SITE_OTHER): Payer: BC Managed Care – PPO | Admitting: Family Medicine

## 2013-12-29 ENCOUNTER — Encounter: Payer: Self-pay | Admitting: Family Medicine

## 2013-12-29 VITALS — BP 118/76 | HR 97 | Temp 98.0°F | Ht 68.0 in | Wt 153.0 lb

## 2013-12-29 DIAGNOSIS — Z0000 Encounter for general adult medical examination without abnormal findings: Secondary | ICD-10-CM

## 2013-12-29 NOTE — Progress Notes (Signed)
   Subjective:    Patient ID: Cheryl HalonJulie Griffith, female    DOB: [birth year], 48 y.o.   MRN: [id number]  HPI Patient seen for complete physical. She sees gynecologist for Pap smears and mammograms. She had colonoscopy couple years ago which apparently was normal. Tetanus up-to-date. Already received flu vaccine.  Chronic problems include history of asthma, GERD, chronic tension-type headaches. She's also had frequent vertigo attacks and takes low-dose diazepam for that.  Past Medical History  Diagnosis Date  . Asthma   . Anxiety   . Depression   . GERD (gastroesophageal reflux disease)   . JYNWGNFA(213.0Headache(784.0)    Past Surgical History  Procedure Laterality Date  . Tonsillectomy  2007  . Eye surgery  1972  . Laparoscopic tubal ligation  10/03/2010    Procedure: LAPAROSCOPIC TUBAL LIGATION;  Surgeon: Miguel AschoffAllan Ross;  Location: [facility];  Service: Gynecology;  Laterality: Bilateral;    reports that she has quit smoking. She has never used smokeless tobacco. She reports that she drinks about 1.2 ounces of alcohol per week. She reports that she does not use illicit drugs. family history is not on file. Allergies  Allergen Reactions  . Citalopram Hydrobromide Nausea And Vomiting      Review of Systems  Constitutional: Negative for fever, activity change, appetite change, fatigue and unexpected weight change.  HENT: Negative for ear pain, hearing loss, sore throat and trouble swallowing.   Eyes: Negative for visual disturbance.  Respiratory: Negative for cough and shortness of breath.   Cardiovascular: Negative for chest pain and palpitations.  Gastrointestinal: Negative for abdominal pain, diarrhea, constipation and blood in stool.  Genitourinary: Negative for dysuria and hematuria.  Musculoskeletal: Negative for arthralgias, back pain and myalgias.  Skin: Negative for rash.  Neurological: Positive for headaches. Negative for dizziness and syncope.  Hematological: Negative for adenopathy.    Psychiatric/Behavioral: Negative for confusion and dysphoric mood.       Objective:   Physical Exam  Constitutional: She is oriented to person, place, and time. She appears well-developed and well-nourished.  HENT:  Head: Normocephalic and atraumatic.  Eyes: EOM are normal. Pupils are equal, round, and reactive to light.  Neck: Normal range of motion. Neck supple. No thyromegaly present.  Cardiovascular: Normal rate, regular rhythm and normal heart sounds.   No murmur heard. Pulmonary/Chest: Breath sounds normal. No respiratory distress. She has no wheezes. She has no rales.  Abdominal: Soft. Bowel sounds are normal. She exhibits no distension and no mass. There is no tenderness. There is no rebound and no guarding.  Musculoskeletal: Normal range of motion. She exhibits no edema.  Lymphadenopathy:    She has no cervical adenopathy.  Neurological: She is alert and oriented to person, place, and time. She displays normal reflexes. No cranial nerve deficit.  Skin: No rash noted.  Psychiatric: She has a normal mood and affect. Her behavior is normal. Judgment and thought content normal.          Assessment & Plan:  Complete physical. Labs reviewed. She has high triglycerides and low HDL. We discussed low saturated diet and close monitoring of sugar and starch intake. Flu vaccine already given. Tetanus up-to-date. Continue GYN followup.  Tension-type headaches which are chronic. We've recommended against frequent use of opioids. We discussed possible change to another tricyclic such as nortriptyline if she comes off her doxepin (which is prescribed by another physician).

## 2013-12-29 NOTE — Progress Notes (Signed)
Pre visit review using our clinic review tool, if applicable. No additional management support is needed unless otherwise documented below in the visit note. 

## 2014-01-08 ENCOUNTER — Other Ambulatory Visit: Payer: Self-pay | Admitting: Family Medicine

## 2014-01-09 NOTE — Telephone Encounter (Signed)
Last visit 12/29/13 Last refill 12/14/13 #30 0 refill

## 2014-01-10 ENCOUNTER — Other Ambulatory Visit: Payer: Self-pay | Admitting: Family Medicine

## 2014-01-10 NOTE — Telephone Encounter (Signed)
Refill once. Recommend against regular use. 

## 2014-01-18 ENCOUNTER — Encounter: Payer: Self-pay | Admitting: Family Medicine

## 2014-02-06 ENCOUNTER — Telehealth: Payer: Self-pay | Admitting: Family Medicine

## 2014-02-06 MED ORDER — HYDROCODONE-ACETAMINOPHEN 7.5-325 MG PO TABS: ORAL_TABLET | ORAL | Status: DC

## 2014-02-06 NOTE — Telephone Encounter (Signed)
Last visit 12/29/13 Last refill 12/26/13 #30 0 refill

## 2014-02-06 NOTE — Telephone Encounter (Signed)
She should not be taking this regularly for headaches.  May refill #15 but try to use sparingly.  We need to look at other prophylactics if headaches occuring frequently.

## 2014-02-06 NOTE — Telephone Encounter (Signed)
Pt request refill HYDROcodone-acetaminophen (NORCO) 7.5-325 MG per tablet °

## 2014-02-06 NOTE — Telephone Encounter (Signed)
Pt is aware that Rx is ready for pickup  

## 2014-02-10 ENCOUNTER — Other Ambulatory Visit: Payer: Self-pay | Admitting: Family Medicine

## 2014-02-13 NOTE — Telephone Encounter (Signed)
Valium 01/10/14 #30 0 refill  Ambien 11/08/13 #45 0 refill  Last visit 12/29/2013

## 2014-02-14 NOTE — Telephone Encounter (Signed)
Refill Ambien with one additional refill. I would try to gradually get off the Diazepam, if possible. May refill Diazepam once

## 2014-03-22 ENCOUNTER — Other Ambulatory Visit: Payer: Self-pay | Admitting: Family Medicine

## 2014-03-22 NOTE — Telephone Encounter (Signed)
Need clarification.  We had in notes that she is taking the Diazepam for Vertigo "attacks" but appears that she is taking this daily (one daily).  Is she using for insomnia?  Would be advised to avoid regular use.

## 2014-03-23 NOTE — Telephone Encounter (Signed)
Pt states that she does take the medication every day for her Vertigo.

## 2014-03-25 NOTE — Telephone Encounter (Signed)
Refill once.  Try to avoid regular use. 

## 2014-03-27 ENCOUNTER — Telehealth: Payer: Self-pay | Admitting: Family Medicine

## 2014-03-27 NOTE — Telephone Encounter (Signed)
Pt request refill HYDROcodone-acetaminophen (NORCO) 7.5-325 MG per tablet °

## 2014-03-27 NOTE — Telephone Encounter (Signed)
Last visit 12/29/13 Last refill 02/06/14 #15 0 refill

## 2014-03-27 NOTE — Telephone Encounter (Signed)
Refill OK

## 2014-03-28 MED ORDER — HYDROCODONE-ACETAMINOPHEN 7.5-325 MG PO TABS: ORAL_TABLET | ORAL | Status: DC

## 2014-03-28 NOTE — Telephone Encounter (Signed)
Pt is aware that RX is ready for pick up. 

## 2014-04-09 ENCOUNTER — Other Ambulatory Visit: Payer: Self-pay | Admitting: Family Medicine

## 2014-04-12 ENCOUNTER — Telehealth: Payer: Self-pay | Admitting: Family Medicine

## 2014-04-12 MED ORDER — HYDROCODONE-ACETAMINOPHEN 7.5-325 MG PO TABS: ORAL_TABLET | ORAL | Status: DC

## 2014-04-12 NOTE — Telephone Encounter (Signed)
Refill OK.  As discussed previously, should not be taking regularly for headaches.

## 2014-04-12 NOTE — Telephone Encounter (Signed)
Last visit 12/29/13 Last refill 03/28/14 #15 0 refill

## 2014-04-12 NOTE — Telephone Encounter (Signed)
Pt aware that RX is ready for pick up  

## 2014-04-12 NOTE — Telephone Encounter (Signed)
Pt request refill HYDROcodone-acetaminophen (NORCO) 7.5-325 MG per tablet °

## 2014-04-25 ENCOUNTER — Other Ambulatory Visit: Payer: Self-pay | Admitting: Family Medicine

## 2014-04-26 ENCOUNTER — Other Ambulatory Visit: Payer: Self-pay | Admitting: Family Medicine

## 2014-04-26 NOTE — Telephone Encounter (Signed)
Last visit 12/29/13 Last refill 03/27/14 #30 0 refill

## 2014-04-26 NOTE — Telephone Encounter (Signed)
Refill with 3 additional refills   

## 2014-04-30 ENCOUNTER — Other Ambulatory Visit: Payer: Self-pay | Admitting: Family Medicine

## 2014-05-05 ENCOUNTER — Telehealth: Payer: Self-pay | Admitting: Family Medicine

## 2014-05-05 NOTE — Telephone Encounter (Signed)
Pt needs new rx hydrocodone °

## 2014-05-05 NOTE — Telephone Encounter (Signed)
Last visit 12/29/13 Last refil 04/12/14 #15 0 refill

## 2014-05-08 MED ORDER — HYDROCODONE-ACETAMINOPHEN 7.5-325 MG PO TABS: ORAL_TABLET | ORAL | Status: DC

## 2014-05-08 NOTE — Telephone Encounter (Signed)
Pt aware that Rx is ready for pick up 

## 2014-05-08 NOTE — Telephone Encounter (Signed)
Refill #12.  She should be taking only rarely for headaches  As mentioned previously, if she is requiring more often need to consider headache prevention options.

## 2014-05-22 ENCOUNTER — Telehealth: Payer: Self-pay | Admitting: Family Medicine

## 2014-05-22 ENCOUNTER — Telehealth: Payer: Self-pay | Admitting: *Deleted

## 2014-05-22 NOTE — Telephone Encounter (Signed)
Pt is requesting paperwork that needs to get to her employer stating her medication list and a signed letter that outlines her migraine treatment plan. If cannot be done to employer, then pt is requesting this information asap  To:  [employer] [place] [place] Fax: [phone] email  [email or web address]  Pt needs this info or she may loose her job. Pt insisted on making an appt to get this if she needs to appt is  Appt scheduled 8:45 am wed

## 2014-05-22 NOTE — Telephone Encounter (Signed)
Left message for patient to return call.

## 2014-05-22 NOTE — Telephone Encounter (Signed)
Pt called requesting to talk with Mont[employer] about paper work. Please advise

## 2014-05-22 NOTE — Telephone Encounter (Signed)
Letter done

## 2014-05-22 NOTE — Telephone Encounter (Signed)
Pt asking about paperwork that was sent over. The form was placed in your folder.

## 2014-05-23 NOTE — Telephone Encounter (Signed)
All of that has been faxed.

## 2014-05-23 NOTE — Telephone Encounter (Signed)
Pt aware, appt cancelled. She also states please remember the med list as well, and thank you so much!

## 2014-05-23 NOTE — Telephone Encounter (Signed)
Pt would like to pu a copy for her records

## 2014-05-23 NOTE — Telephone Encounter (Signed)
At the front for pick up

## 2014-05-23 NOTE — Telephone Encounter (Signed)
Letter is done will fax letter today 05/23/14. Pt does not need to come in for a visit.

## 2014-05-24 ENCOUNTER — Ambulatory Visit: Payer: Self-pay | Admitting: Family Medicine

## 2014-05-29 ENCOUNTER — Telehealth: Payer: Self-pay | Admitting: Family Medicine

## 2014-05-29 MED ORDER — HYDROCODONE-ACETAMINOPHEN 7.5-325 MG PO TABS: ORAL_TABLET | ORAL | Status: AC

## 2014-05-29 NOTE — Telephone Encounter (Signed)
Last visit 12/29/13 Last refill 05/08/14 #12 0 refill

## 2014-05-29 NOTE — Telephone Encounter (Signed)
She needs to be tapered off this. I would suggest follow up here to discuss headache prophylaxis options but if she is getting ready to move would wait.  May refill once only.  If she will be several months before moving would see her here to go over prevention strategies.

## 2014-05-29 NOTE — Telephone Encounter (Signed)
Pt requesting refill of HYDROcodone-acetaminophen (NORCO) 7.5-325 MG per tablet.  Pt also would like PCP to know she will be [place] to [place] but hasn't found a physician down there yet.

## 2014-05-29 NOTE — Telephone Encounter (Signed)
Pt informed and Rx is ready for pickup

## 2014-07-09 ENCOUNTER — Other Ambulatory Visit: Payer: Self-pay | Admitting: Family Medicine

## 2014-07-15 ENCOUNTER — Other Ambulatory Visit: Payer: Self-pay | Admitting: Family Medicine

## 2014-08-11 ENCOUNTER — Telehealth: Payer: Self-pay | Admitting: Family Medicine

## 2014-08-11 MED ORDER — ZOLPIDEM TARTRATE 5 MG PO TABS: ORAL_TABLET | ORAL | Status: DC

## 2014-08-11 NOTE — Telephone Encounter (Signed)
Rx called in 

## 2014-08-11 NOTE — Telephone Encounter (Signed)
Pt request refill zolpidem (AMBIEN) 5 MG tablet Pt is in [place] now. [facility]

## 2014-08-11 NOTE — Telephone Encounter (Signed)
Appears pt not seen in some time and has moved. Given PCP not in office, ok to give #10, no further refills without PCP approval.

## 2014-08-20 ENCOUNTER — Other Ambulatory Visit: Payer: Self-pay | Admitting: Family Medicine

## 2014-08-31 IMAGING — MG MM DIGITAL SCREENING BILAT W/ CAD
4 series · 4 of 4 positions shown · non-contrast
Comparison: Previous exam(s).

CLINICAL DATA: Screening. Family history of breast cancer in a
paternal aunt at age 59. Prior benign excisional biopsy of the right
breast in 1000.

EXAM:
DIGITAL SCREENING BILATERAL MAMMOGRAM WITH CAD

[R CC]
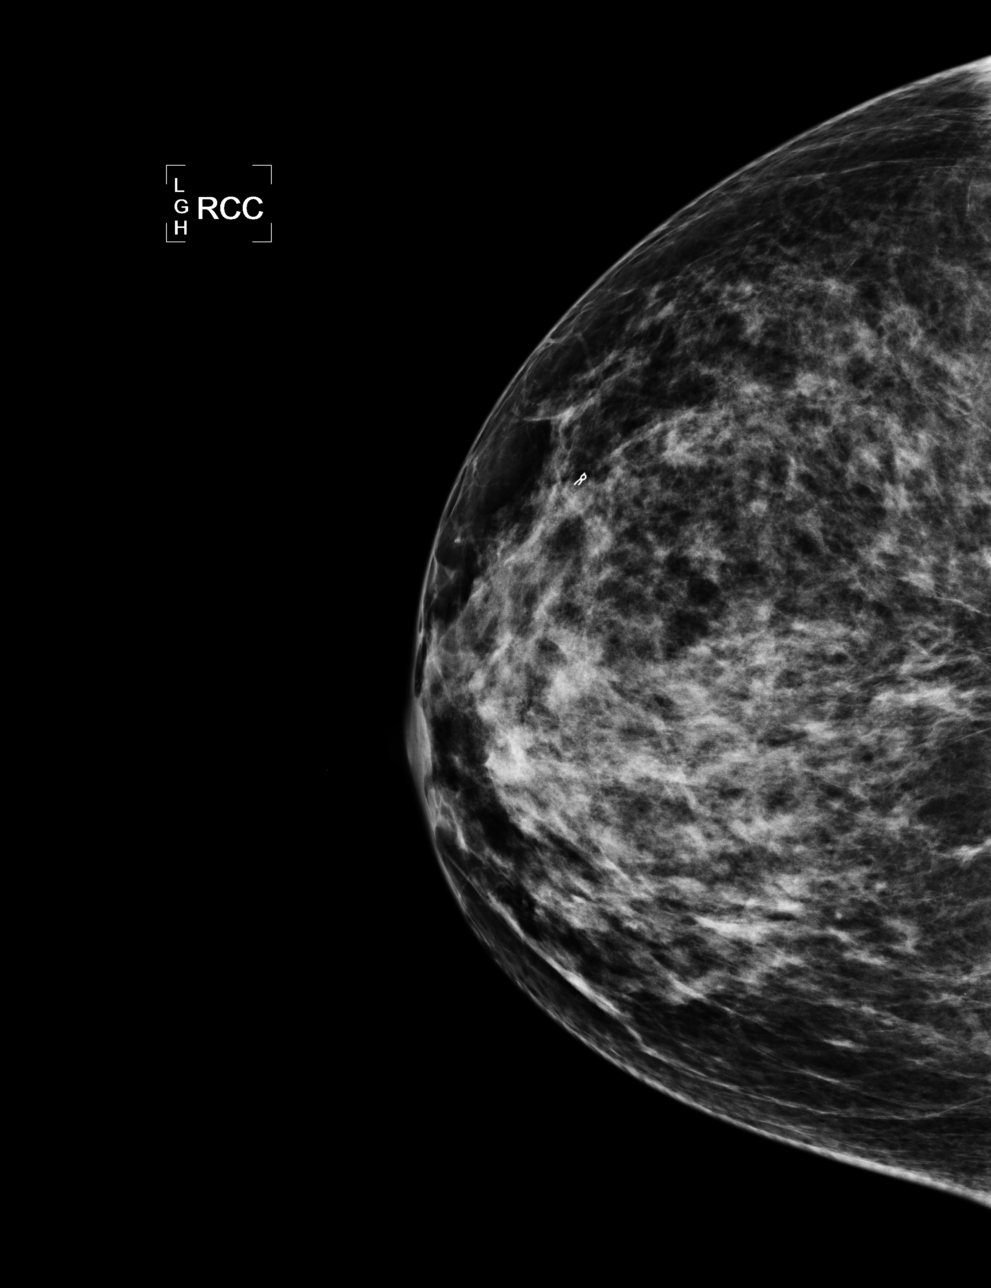

[L CC]
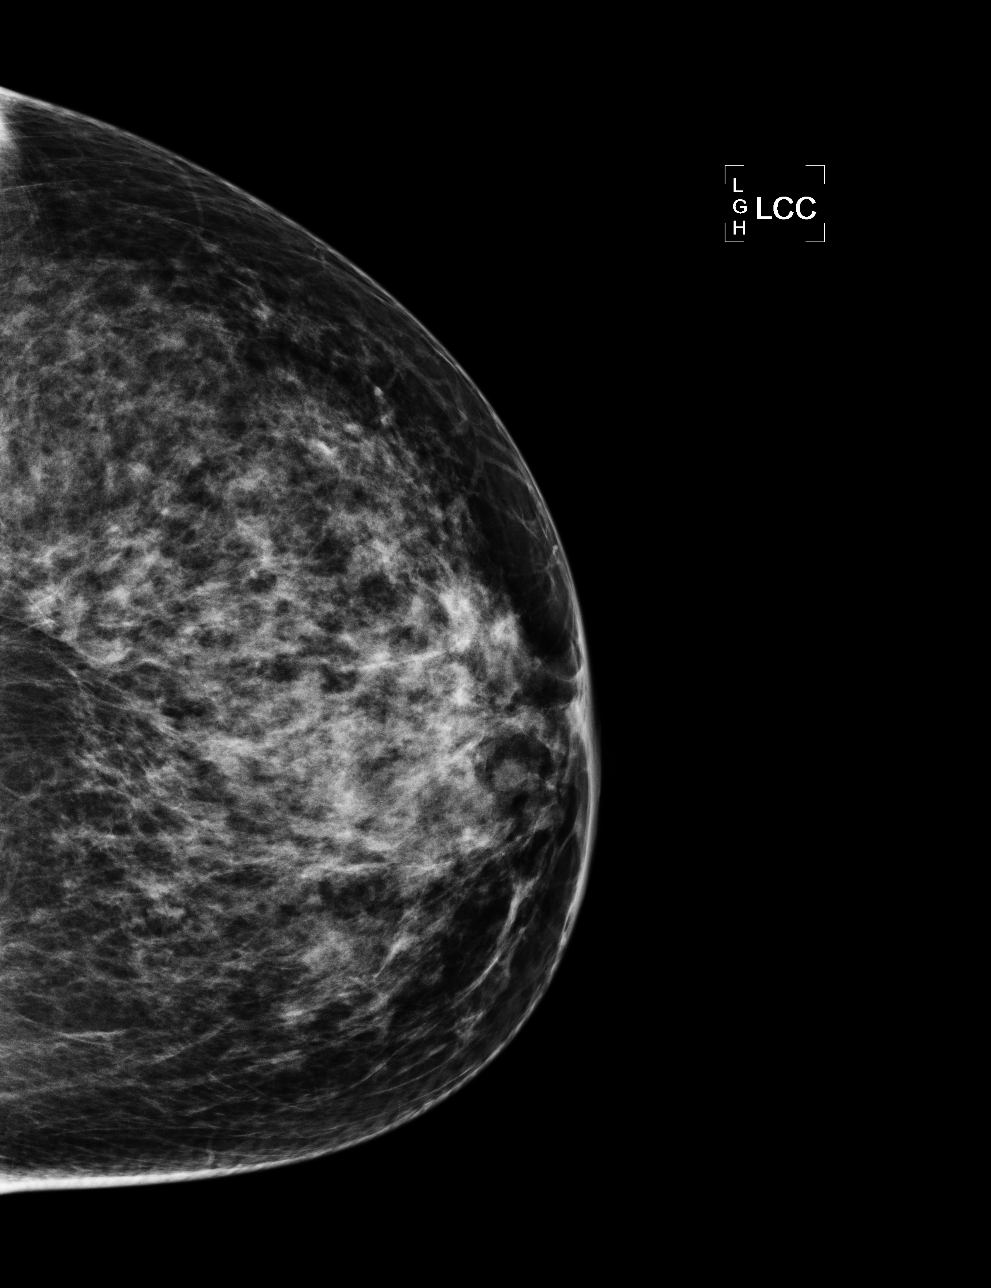

[L MLO]
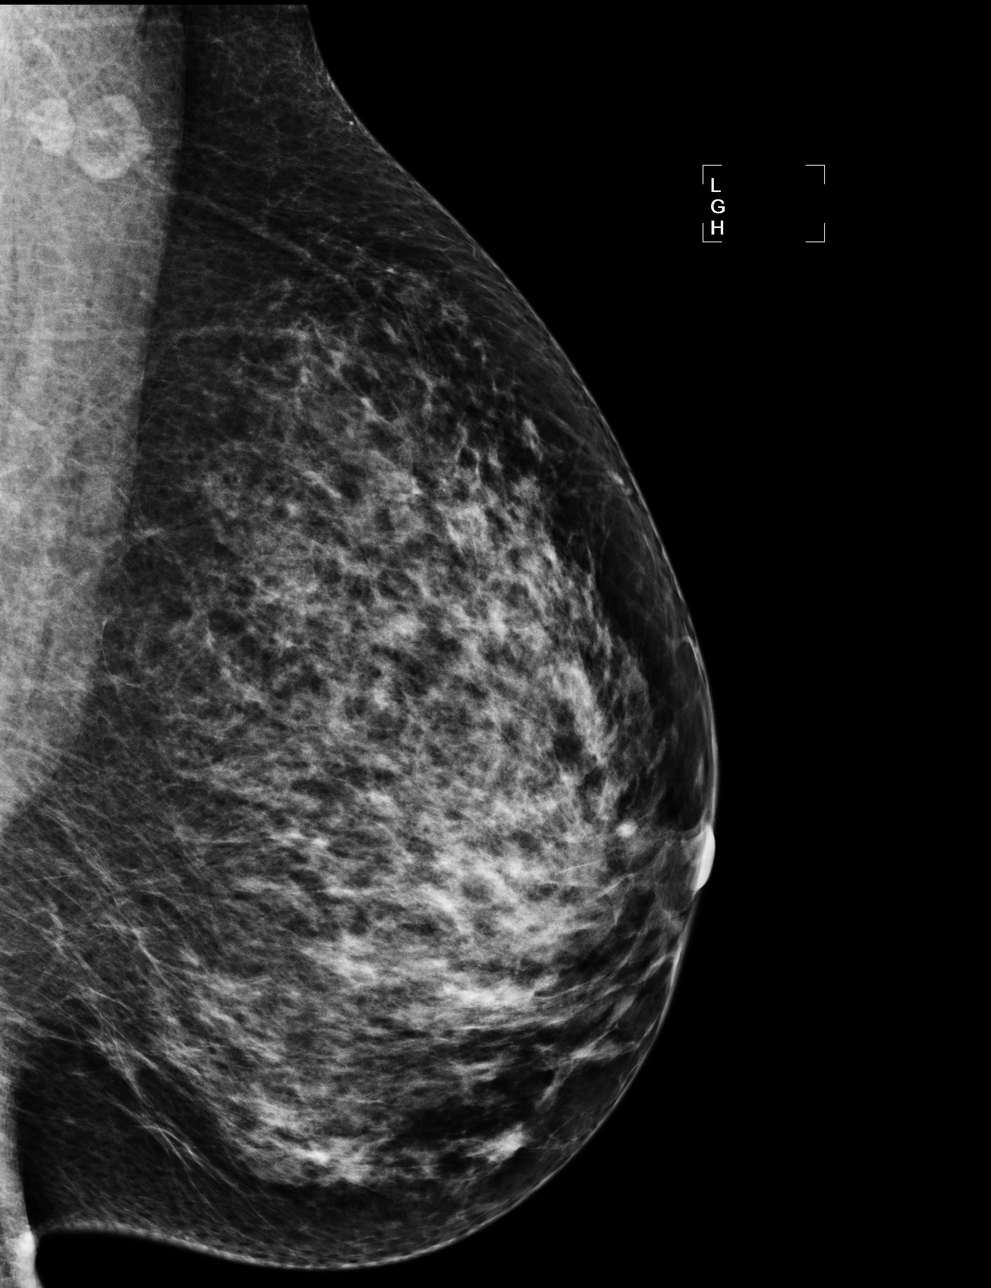

[R MLO]
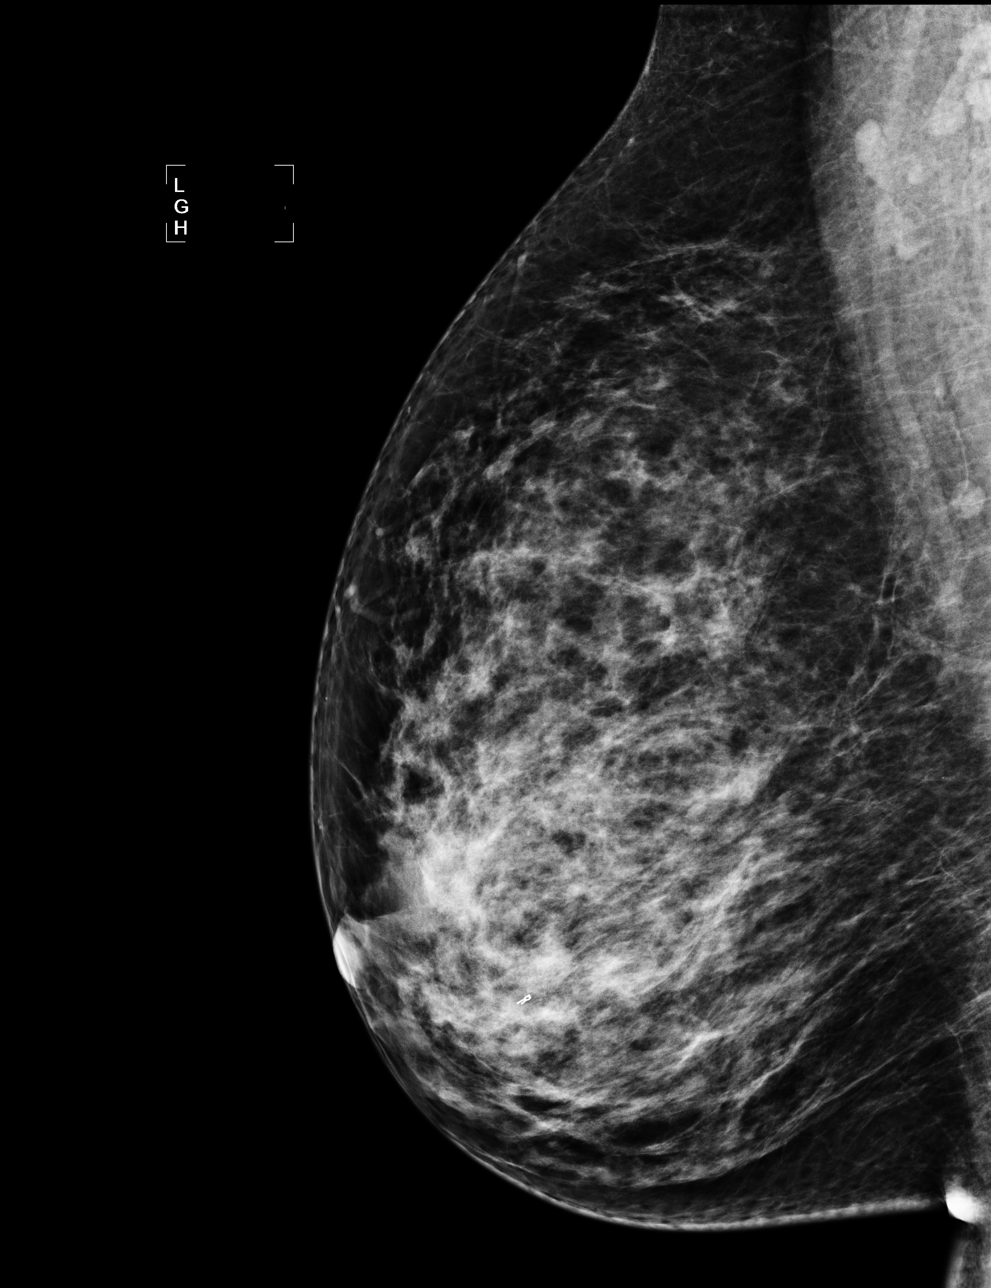

[4 of 4 positions shown; findings below may reference images not displayed]

ACR Breast Density Category c: The breast tissue is heterogeneously
dense, which may obscure small masses.
FINDINGS: There are no findings suspicious for malignancy. Images were
processed with CAD.
IMPRESSION: No mammographic evidence of malignancy. A result letter of this
screening mammogram will be mailed directly to the patient.

RECOMMENDATION:
Screening mammogram in one year. (Code:LQ-[id number])

BI-RADS CATEGORY  1: Negative.

## 2014-10-01 ENCOUNTER — Other Ambulatory Visit: Payer: Self-pay | Admitting: Family Medicine

## 2014-10-26 ENCOUNTER — Other Ambulatory Visit: Payer: Self-pay | Admitting: Family Medicine

## 2014-10-27 NOTE — Telephone Encounter (Signed)
Last visit 12/29/13 Last refill 08/11/14 #10 0 refill

## 2014-10-29 NOTE — Telephone Encounter (Signed)
Refill once.  Try to avoid regular use. 

## 2014-10-30 ENCOUNTER — Other Ambulatory Visit: Payer: Self-pay | Admitting: Family Medicine

## 2014-11-13 ENCOUNTER — Other Ambulatory Visit: Payer: Self-pay | Admitting: Family Medicine

## 2014-11-27 ENCOUNTER — Other Ambulatory Visit: Payer: Self-pay | Admitting: Family Medicine
